# Patient Record
Sex: Female | Born: 1970 | Race: White | Hispanic: No | Marital: Married | Smoking: Never smoker
Health system: Southern US, Community
[De-identification: ages and names within clinical notes are randomized; demographics above are authoritative.]

## PROBLEM LIST (undated history)

## (undated) DIAGNOSIS — K219 Gastro-esophageal reflux disease without esophagitis: Secondary | ICD-10-CM

## (undated) DIAGNOSIS — J398 Other specified diseases of upper respiratory tract: Secondary | ICD-10-CM

## (undated) DIAGNOSIS — D649 Anemia, unspecified: Secondary | ICD-10-CM

## (undated) DIAGNOSIS — R0600 Dyspnea, unspecified: Secondary | ICD-10-CM

## (undated) DIAGNOSIS — Z9189 Other specified personal risk factors, not elsewhere classified: Secondary | ICD-10-CM

## (undated) DIAGNOSIS — F32A Depression, unspecified: Secondary | ICD-10-CM

## (undated) DIAGNOSIS — J302 Other seasonal allergic rhinitis: Secondary | ICD-10-CM

## (undated) DIAGNOSIS — L309 Dermatitis, unspecified: Secondary | ICD-10-CM

## (undated) DIAGNOSIS — IMO0001 Reserved for inherently not codable concepts without codable children: Secondary | ICD-10-CM

## (undated) DIAGNOSIS — F419 Anxiety disorder, unspecified: Secondary | ICD-10-CM

## (undated) DIAGNOSIS — F329 Major depressive disorder, single episode, unspecified: Secondary | ICD-10-CM

## (undated) DIAGNOSIS — J45909 Unspecified asthma, uncomplicated: Secondary | ICD-10-CM

## (undated) HISTORY — PX: EPIDURAL BLOCK INJECTION: SHX1516

---

## 1993-09-02 HISTORY — PX: WISDOM TOOTH EXTRACTION: SHX21

## 2000-10-06 ENCOUNTER — Other Ambulatory Visit: Admission: RE | Admit: 2000-10-06 | Discharge: 2000-10-06 | Payer: Self-pay | Admitting: Obstetrics and Gynecology

## 2001-04-20 ENCOUNTER — Other Ambulatory Visit: Admission: RE | Admit: 2001-04-20 | Discharge: 2001-04-20 | Payer: Self-pay | Admitting: Obstetrics and Gynecology

## 2002-05-12 ENCOUNTER — Other Ambulatory Visit: Admission: RE | Admit: 2002-05-12 | Discharge: 2002-05-12 | Payer: Self-pay | Admitting: Obstetrics and Gynecology

## 2004-05-06 ENCOUNTER — Inpatient Hospital Stay (HOSPITAL_COMMUNITY): Admission: AD | Admit: 2004-05-06 | Discharge: 2004-05-06 | Payer: Self-pay | Admitting: Obstetrics and Gynecology

## 2004-08-14 ENCOUNTER — Other Ambulatory Visit: Admission: RE | Admit: 2004-08-14 | Discharge: 2004-08-14 | Payer: Self-pay | Admitting: Obstetrics and Gynecology

## 2005-03-06 ENCOUNTER — Inpatient Hospital Stay (HOSPITAL_COMMUNITY): Admission: AD | Admit: 2005-03-06 | Discharge: 2005-03-09 | Payer: Self-pay | Admitting: Obstetrics and Gynecology

## 2005-09-19 ENCOUNTER — Other Ambulatory Visit: Admission: RE | Admit: 2005-09-19 | Discharge: 2005-09-19 | Payer: Self-pay | Admitting: Obstetrics and Gynecology

## 2005-11-15 ENCOUNTER — Ambulatory Visit (HOSPITAL_BASED_OUTPATIENT_CLINIC_OR_DEPARTMENT_OTHER): Admission: RE | Admit: 2005-11-15 | Discharge: 2005-11-15 | Payer: Self-pay | Admitting: Plastic Surgery

## 2005-11-15 ENCOUNTER — Encounter (INDEPENDENT_AMBULATORY_CARE_PROVIDER_SITE_OTHER): Payer: Self-pay | Admitting: Specialist

## 2006-09-17 ENCOUNTER — Other Ambulatory Visit: Admission: RE | Admit: 2006-09-17 | Discharge: 2006-09-17 | Payer: Self-pay | Admitting: Gynecology

## 2006-09-26 ENCOUNTER — Ambulatory Visit: Payer: Self-pay | Admitting: Oncology

## 2006-10-02 LAB — CBC WITH DIFFERENTIAL (CANCER CENTER ONLY)
BASO%: 0.6 % (ref 0.0–2.0)
Eosinophils Absolute: 0.1 10*3/uL (ref 0.0–0.5)
MONO#: 0.3 10*3/uL (ref 0.1–0.9)
MONO%: 4.1 % (ref 0.0–13.0)
NEUT#: 5.3 10*3/uL (ref 1.5–6.5)
Platelets: 336 10*3/uL (ref 145–400)
RBC: 4.43 10*6/uL (ref 3.70–5.32)
WBC: 8.1 10*3/uL (ref 3.9–10.0)

## 2006-10-02 LAB — IVY BLEEDING TIME - CHCC SATELLITE: Bleeding Time: 4 Minutes (ref 2.0–8.0)

## 2006-10-08 LAB — LACTATE DEHYDROGENASE: LDH: 132 U/L (ref 94–250)

## 2006-10-08 LAB — HYPERCOAGULABLE PANEL, COMPREHENSIVE
AntiThromb III Func: 107 % (ref 75–120)
Anticardiolipin IgA: <7 [APL'U]
Anticardiolipin IgG: <7 [GPL'U]
Anticardiolipin IgM: 10 [MPL'U]
Beta-2 Glyco I IgG: 5 U/mL
Beta-2-Glycoprotein I IgA: 8 U/mL
Beta-2-Glycoprotein I IgM: <4 U/mL
DRVVT 1:1 Mix: 43 s (ref 31.9–44.2)
DRVVT: 55.3 s — ABNORMAL HIGH (ref 31.9–44.2)
Homocysteine: 6 umol/L (ref 4.0–15.4)
PTT Lupus Anticoagulant: 49.4 s — ABNORMAL HIGH (ref 36.3–48.8)
PTTLA 4:1 Mix: 46.4 s (ref 36.3–48.8)
Protein C Activity: 148 % — ABNORMAL HIGH (ref 91–147)
Protein C, Total: 122 % (ref 70–140)
Protein S Activity: 113 % (ref 81–180)
Protein S Ag, Total: 143 % — ABNORMAL HIGH (ref 70–140)

## 2006-10-08 LAB — COMPREHENSIVE METABOLIC PANEL
ALT: 14 U/L (ref 0–35)
Alkaline Phosphatase: 55 U/L (ref 39–117)
CO2: 27 mEq/L (ref 19–32)
Sodium: 135 mEq/L (ref 135–145)
Total Bilirubin: 0.5 mg/dL (ref 0.3–1.2)
Total Protein: 7.4 g/dL (ref 6.0–8.3)

## 2006-10-08 LAB — VON WILLEBRAND FACTOR MULTIMER

## 2007-06-19 ENCOUNTER — Inpatient Hospital Stay (HOSPITAL_COMMUNITY): Admission: AD | Admit: 2007-06-19 | Discharge: 2007-06-21 | Payer: Self-pay | Admitting: Obstetrics and Gynecology

## 2009-12-01 DIAGNOSIS — D649 Anemia, unspecified: Secondary | ICD-10-CM

## 2009-12-01 HISTORY — DX: Anemia, unspecified: D64.9

## 2009-12-28 ENCOUNTER — Inpatient Hospital Stay (HOSPITAL_COMMUNITY): Admission: AD | Admit: 2009-12-28 | Discharge: 2009-12-30 | Payer: Self-pay | Admitting: Obstetrics and Gynecology

## 2010-11-20 LAB — CBC
Hemoglobin: 11 g/dL — ABNORMAL LOW (ref 12.0–15.0)
MCHC: 35.1 g/dL (ref 30.0–36.0)
MCV: 90.8 fL (ref 78.0–100.0)
Platelets: 249 10*3/uL (ref 150–400)
RDW: 13.3 % (ref 11.5–15.5)
WBC: 13.7 10*3/uL — ABNORMAL HIGH (ref 4.0–10.5)

## 2011-01-15 NOTE — Discharge Summary (Signed)
NAMEKEYARIA, Denise Barr                 ACCOUNT NO.:  [id number]   MEDICAL RECORD NO.:  [id number]          PATIENT TYPE:  INP   LOCATION:  9102                          FACILITY:  [facility]   PHYSICIAN:  Malachi Pro. Ambrose Mantle, M.D. DATE OF BIRTH:  1971/03/24   DATE OF ADMISSION:  06/19/2007  DATE OF DISCHARGE:  06/21/2007                               DISCHARGE SUMMARY   This is a 41 year old white married female, para 1-0-0-1, gravida 2, EDC  June 27, 2007, admitted with irregular contractions.  The cervix was  5 cm dilated.  Blood group and type A positive, negative antibody,  nonreactive serology, rubella immune, hepatitis B surface antigen  negative, HIV negative, GC and chlamydia negative.  Cystic fibrosis  negative.  One-hour Glucola 126.  Group B Strep was negative.  Vaginal  ultrasound on December 03, 2006:  Crown-rump length 3.58 cm, 10 weeks 4  days, [facility] June 27, 2007.  She declined first and second-trimester  screens.  An 18-week ultrasound showed normal anatomy, compatible with  dates.  The patient was treated with ferrous sulfate for anemia.  The  cervix was 2 cm on June 09, 2007, and on the day of admission with  contractions 4-7 minutes apart the cervix was 5 cm, 80%, vertex at a -2   PAST MEDICAL HISTORY:  No known allergies. Wisdom teeth extracted in  1995.  No illnesses.   ALCOHOL, TOBACCO, AND DRUGS:  None.   FAMILY HISTORY:  Mother with factor V Leiden.  Maternal grandmother with  throat cancer and diabetes.  Maternal grandfather with congestive heart  failure.   OBSTETRIC HISTORY:  In July 2006, an 8 pounds female vaginally, no  complications.   PHYSICAL EXAMINATION ON ADMISSION:  VITAL SIGNS:  Normal vital signs.  HEART/LUNGS:  Normal.  ABDOMEN:  Soft.  Fundal height 35.5 cm.  Fetal heart tones normal.  PELVIC:  Cervix 7 cm, 90%, vertex at a -2 with a bulging bag of waters.   At 5:48 p.m., with the cervix 8-9 cm, 100%, vertex at a -2, artificial  rupture of  membranes produced clear fluid.  She progressed to full  dilatation, pushed well, and delivered spontaneously LOA over an intact  perineum a living female infant, 6 pounds 15 ounces, without Apgars of 9  at one and 9 at five minutes.  Placenta was intact.  The uterus was  normal.  A small right labial laceration was hemostatic and not  repaired.  Postpartum, the patient did well and was discharged on the  second postpartum day.  Initial hemoglobin 11.1, hematocrit 31.8, white  count 10,400.  Follow-up hemoglobin 11.4.  Platelet count was 336,000.  RPR nonreactive.   FINAL DIAGNOSIS:  Intrauterine pregnancy at 39 weeks delivered LOA.   OPERATION:  Spontaneous delivery LOA.   FINAL CONDITION:  Improved.   INSTRUCTIONS:  Include our regular discharge instructions included in  our booklet.  The patient is advised to take Percocet 5/325, 20 tablets,  1 q.4-6 h. as needed for pain, and return to the office in 6 weeks for  follow-up examination.  Malachi Pro. Ambrose Mantle, M.D.  Electronically Signed     TFH/MEDQ  D:  06/21/2007  T:  06/22/2007  Job:  [id number]

## 2011-01-18 NOTE — Discharge Summary (Signed)
NAMEJODE, LIPPE                 ACCOUNT NO.:  [id number]   MEDICAL RECORD NO.:  [id number]          PATIENT TYPE:  INP   LOCATION:  9134                          FACILITY:  [facility]   PHYSICIAN:  Malachi Pro. Ambrose Mantle, M.D. DATE OF BIRTH:  Feb 01, 1971   DATE OF ADMISSION:  03/06/2005  DATE OF DISCHARGE:  03/09/2005                                 DISCHARGE SUMMARY   HISTORY OF PRESENT ILLNESS:  40 year old white female para 0, gravida 1, 40  weeks by 10 week ultrasound with EDC of March 07, 2005 presented complaining  of regular contractions, no vaginal bleeding or rupture of membranes, good  fetal movement.  She was evaluated in maternity admission unit with the  cervix 5 to 6 cm and regular contractions.  She was admitted and received an  epidural.  Blood group and type A positive, negative antibody, RPR  nonreactive, rubella immune, hepatitis B surface antigen negative, GC and  Chlamydia negative.  Group B Strep negative. One hour Glucola 151.  Three  hour GTT 78, 110, 104 and 100.  The patient was said to have PCOS and she  was on metformin.  She was exposed to parvo virus, had negative titers.   PAST SURGICAL HISTORY:  Wisdom teeth extraction.   PHYSICAL EXAMINATION:  VITAL SIGNS:  On admission her vital signs were  normal.  HEART:  Normal size and sounds.  No murmurs.  LUNGS:  Clear to auscultation.  ABDOMEN:  Soft and gravid.  Contractions every three to four minutes.  Mild  variable decelerations.  Positive scalp stimulation.   HOSPITAL COURSE:  Dr. Leighton Roach Meisinger examined her at 3:25 A.M. and the  cervix was rim, dilated, vertex at a +1 station.  Artificial rupture of the  membranes produced clear fluid.  She progressed to complete dilatation and  pushed well.  She had a spontaneous vaginal delivery of a living female  infant, 8 pounds 0 ounces, Apgar's of 9 at 1 minute and 9 at 5 minutes.  The  infant was delivered LOA.  The right arm delivered after the head.  Placenta  spontaneous and intact.  Small right labial and first degree vaginal  lacerations repaired with 3-0 Vicryl, left labial laceration hemostatic and  not repaired.  Blood loss less than 500 cc.  Postpartum the patient did well  and was discharged on the second postpartum day.  RPR was nonreactive.  Hemoglobin was 11.2, hematocrit 33.3, white count 13,500, platelet count  296,000.   FINAL DIAGNOSIS:  Intrauterine pregnancy at term delivered LOA.   PROCEDURE:  Spontaneous delivery LOA.  Repair first degree vaginal and right  labial lacerations.   CONDITION ON DISCHARGE:  Final condition improved.   DISCHARGE INSTRUCTIONS:  Instructions include our regular discharge  instruction booklet.   DISCHARGE MEDICATIONS:  Percocet 5/325 mg, #20 tablets, take one q.4-6h. as  needed for pain.   FOLLOW UP:  Patient is advised to return in six weeks for follow up  examination.       TFH/MEDQ  D:  03/09/2005  T:  03/09/2005  Job:  [id number]

## 2011-01-18 NOTE — Op Note (Signed)
Denise Barr, Denise Barr                 ACCOUNT NO.:  [id number]   MEDICAL RECORD NO.:  [id number]          PATIENT TYPE:  AMB   LOCATION:  DSC                          FACILITY:  [facility]   PHYSICIAN:  Alfredia Ferguson, M.D.  DATE OF BIRTH:  [birth year]   DATE OF PROCEDURE:  11/15/2005  DATE OF DISCHARGE:                                 OPERATIVE REPORT   PREOPERATIVE DIAGNOSES:  1.  Biopsy-proven dysplastic nevus, left upper back.  2.  Biopsy-proven dysplastic nevus, upper gluteal cleft.   POSTOPERATIVE DIAGNOSES:  1.  Biopsy-proven dysplastic nevus, left upper back.  2.  Biopsy-proven dysplastic nevus, upper gluteal cleft.   OPERATION PERFORMED:  1.  Excision of biopsy-proven dysplastic nevus, left upper back, with 2- to      3-mm margins.  2.  Excision of dysplastic nevus, upper gluteal cleft, with 2- to 3-mm      margins.   SURGEON:  Alfredia Ferguson, M.D.   ANESTHESIA:  2% Xylocaine and 1:100,000 epinephrine.   INDICATIONS FOR SURGERY:  This is a 40 year old woman with biopsy-proven  dysplastic nevi on her back, 1 in the gluteal cleft and 1 on the left upper  back.  The patient has been advised to excise these lesions in order to  clear the margins.  The patient understands she is trading what she has for  a permanent and potentially unsightly scar.  In spite that, she wishes to  proceed with the surgery.   DESCRIPTION OF OPERATION:  Skin marks were placed in an elliptical fashion  around the lesion in the left upper back and the gluteal cleft.  Local  anesthesia using 2% Xylocaine and 1:100,000 epinephrine was infiltrated.  Both areas were prepped with Betadine and draped with sterile drapes.  After  waiting 10 minutes, an elliptical excision of the lesion in the left upper  back was carried out with 2- to 3-mm margins.  The specimen was excised at  the level of the subcutaneous tissue.  The specimen was submitted for  Pathology.  Wound edges were undermined for a distance of  several  millimeters in all directions.  Hemostasis was accomplished using pressure.  The wound was closed by approximating the dermis with multiple interrupted 3-  0 Monocryl sutures.  The skin edges were united with a running 4-0 Monocryl  intracuticular.  Steri-Strips were applied to the skin edges.  Attention was  directed to the buttock area, where an identical excision was carried out.  Again, wound edges were undermined for  a distance of several millimeters.  Wound was closed by approximating the  dermis with multiple interrupted 3-0 Monocryl and the skin edges were united  with a running 5-0 nylon suture.  The patient tolerated the procedure well.  Both areas were cleansed and dried and light dressings were applied.      Alfredia Ferguson, M.D.  Electronically Signed     WBB/MEDQ  D:  11/15/2005  T:  11/16/2005  Job:  [id number]   cc:   Hope M. Danella Deis, M.D.  Fax: [phone]

## 2011-06-12 LAB — CBC
HCT: 33 — ABNORMAL LOW
Hemoglobin: 11.4 — ABNORMAL LOW
MCV: 88.2
RBC: 3.61 — ABNORMAL LOW
RBC: 3.64 — ABNORMAL LOW
RDW: 12.9
WBC: 10.4

## 2011-06-12 LAB — RPR: RPR Ser Ql: NONREACTIVE

## 2012-10-06 ENCOUNTER — Other Ambulatory Visit: Payer: Self-pay | Admitting: Obstetrics and Gynecology

## 2012-10-06 DIAGNOSIS — Z1231 Encounter for screening mammogram for malignant neoplasm of breast: Secondary | ICD-10-CM

## 2012-10-22 ENCOUNTER — Ambulatory Visit
Admission: RE | Admit: 2012-10-22 | Discharge: 2012-10-22 | Disposition: A | Discharge status: Home / Self Care | Payer: BC Managed Care – PPO | Source: Ambulatory Visit | Attending: Obstetrics and Gynecology | Admitting: Obstetrics and Gynecology

## 2013-05-20 ENCOUNTER — Other Ambulatory Visit: Payer: Self-pay | Admitting: Family Medicine

## 2013-05-20 DIAGNOSIS — E010 Iodine-deficiency related diffuse (endemic) goiter: Secondary | ICD-10-CM

## 2013-05-26 ENCOUNTER — Ambulatory Visit
Admission: RE | Admit: 2013-05-26 | Discharge: 2013-05-26 | Disposition: A | Discharge status: Home / Self Care | Payer: BC Managed Care – PPO | Source: Ambulatory Visit | Attending: Family Medicine | Admitting: Family Medicine

## 2013-05-26 DIAGNOSIS — E010 Iodine-deficiency related diffuse (endemic) goiter: Secondary | ICD-10-CM

## 2013-05-28 ENCOUNTER — Other Ambulatory Visit: Payer: Self-pay | Admitting: Family Medicine

## 2013-05-28 ENCOUNTER — Ambulatory Visit
Admission: RE | Admit: 2013-05-28 | Discharge: 2013-05-28 | Disposition: A | Discharge status: Home / Self Care | Payer: BC Managed Care – PPO | Source: Ambulatory Visit | Attending: Family Medicine | Admitting: Family Medicine

## 2013-05-28 MED ORDER — IOHEXOL 300 MG/ML  SOLN
75.0000 mL | Freq: Once | INTRAMUSCULAR | Status: AC | PRN
  Administered 2013-05-28: 75 mL via INTRAVENOUS

## 2013-09-23 ENCOUNTER — Other Ambulatory Visit: Payer: Self-pay

## 2013-09-23 DIAGNOSIS — Z1231 Encounter for screening mammogram for malignant neoplasm of breast: Secondary | ICD-10-CM

## 2013-10-11 ENCOUNTER — Encounter (HOSPITAL_COMMUNITY): Payer: Self-pay | Admitting: Pharmacy Technician

## 2013-10-11 ENCOUNTER — Other Ambulatory Visit: Payer: Self-pay | Admitting: Gastroenterology

## 2013-10-11 ENCOUNTER — Encounter (HOSPITAL_COMMUNITY): Payer: Self-pay | Admitting: *Deleted

## 2013-10-22 ENCOUNTER — Ambulatory Visit (HOSPITAL_COMMUNITY): Payer: BC Managed Care – PPO | Admitting: Anesthesiology

## 2013-10-22 ENCOUNTER — Encounter (HOSPITAL_COMMUNITY)
Admission: RE | Disposition: A | Discharge status: Home / Self Care | Payer: Self-pay | Source: Ambulatory Visit | Attending: Gastroenterology

## 2013-10-22 ENCOUNTER — Encounter (HOSPITAL_COMMUNITY): Payer: Self-pay | Admitting: *Deleted

## 2013-10-22 ENCOUNTER — Ambulatory Visit (HOSPITAL_COMMUNITY)
Admission: RE | Admit: 2013-10-22 | Discharge: 2013-10-22 | Disposition: A | Discharge status: Home / Self Care | Payer: BC Managed Care – PPO | Source: Ambulatory Visit | Attending: Gastroenterology | Admitting: Gastroenterology

## 2013-10-22 ENCOUNTER — Encounter (HOSPITAL_COMMUNITY): Payer: BC Managed Care – PPO | Admitting: Anesthesiology

## 2013-10-22 DIAGNOSIS — J398 Other specified diseases of upper respiratory tract: Secondary | ICD-10-CM | POA: Insufficient documentation

## 2013-10-22 DIAGNOSIS — R059 Cough, unspecified: Secondary | ICD-10-CM | POA: Insufficient documentation

## 2013-10-22 DIAGNOSIS — R05 Cough: Secondary | ICD-10-CM | POA: Insufficient documentation

## 2013-10-22 DIAGNOSIS — D649 Anemia, unspecified: Secondary | ICD-10-CM | POA: Insufficient documentation

## 2013-10-22 DIAGNOSIS — J988 Other specified respiratory disorders: Principal | ICD-10-CM

## 2013-10-22 DIAGNOSIS — Z808 Family history of malignant neoplasm of other organs or systems: Secondary | ICD-10-CM | POA: Insufficient documentation

## 2013-10-22 DIAGNOSIS — K219 Gastro-esophageal reflux disease without esophagitis: Secondary | ICD-10-CM | POA: Insufficient documentation

## 2013-10-22 HISTORY — DX: Dermatitis, unspecified: L30.9

## 2013-10-22 HISTORY — DX: Gastro-esophageal reflux disease without esophagitis: K21.9

## 2013-10-22 HISTORY — DX: Anemia, unspecified: D64.9

## 2013-10-22 HISTORY — PX: BRAVO PH STUDY: SHX5421

## 2013-10-22 HISTORY — DX: Other specified diseases of upper respiratory tract: J39.8

## 2013-10-22 HISTORY — PX: ESOPHAGOGASTRODUODENOSCOPY: SHX5428

## 2013-10-22 SURGERY — EGD (ESOPHAGOGASTRODUODENOSCOPY)
Anesthesia: Monitor Anesthesia Care

## 2013-10-22 MED ORDER — KETAMINE HCL 10 MG/ML IJ SOLN
INTRAMUSCULAR | Status: DC | PRN
  Administered 2013-10-22: 25 mg via INTRAVENOUS

## 2013-10-22 MED ORDER — MIDAZOLAM HCL 5 MG/5ML IJ SOLN
INTRAMUSCULAR | Status: DC | PRN
  Administered 2013-10-22 (×2): 1 mg via INTRAVENOUS

## 2013-10-22 MED ORDER — FENTANYL CITRATE 0.05 MG/ML IJ SOLN
INTRAMUSCULAR | Status: DC | PRN
  Administered 2013-10-22: 25 ug via INTRAVENOUS

## 2013-10-22 MED ORDER — SODIUM CHLORIDE 0.9 % IV SOLN: INTRAVENOUS | Status: DC

## 2013-10-22 MED ORDER — ONDANSETRON HCL 4 MG/2ML IJ SOLN
INTRAMUSCULAR | Status: AC
  Filled 2013-10-22: qty 2

## 2013-10-22 MED ORDER — ONDANSETRON HCL 4 MG/2ML IJ SOLN
INTRAMUSCULAR | Status: DC | PRN
  Administered 2013-10-22 (×2): 2 mg via INTRAVENOUS

## 2013-10-22 MED ORDER — MIDAZOLAM HCL 2 MG/2ML IJ SOLN
INTRAMUSCULAR | Status: AC
  Filled 2013-10-22: qty 2

## 2013-10-22 MED ORDER — FENTANYL CITRATE 0.05 MG/ML IJ SOLN
INTRAMUSCULAR | Status: AC
  Filled 2013-10-22: qty 2

## 2013-10-22 MED ORDER — LACTATED RINGERS IV SOLN
INTRAVENOUS | Status: DC
  Administered 2013-10-22: 1000 mL via INTRAVENOUS

## 2013-10-22 MED ORDER — LACTATED RINGERS IV SOLN
INTRAVENOUS | Status: DC | PRN
  Administered 2013-10-22: 11:00:00 via INTRAVENOUS

## 2013-10-22 MED ORDER — PROPOFOL INFUSION 10 MG/ML OPTIME
INTRAVENOUS | Status: DC | PRN
  Administered 2013-10-22: 100 ug/kg/min via INTRAVENOUS

## 2013-10-22 MED ORDER — KETAMINE HCL 10 MG/ML IJ SOLN
INTRAMUSCULAR | Status: AC
  Filled 2013-10-22: qty 1

## 2013-10-22 NOTE — Anesthesia Postprocedure Evaluation (Signed)
Anesthesia Post Note  Patient: Denise Barr  Procedure(s) Performed: Procedure(s) (LRB): ESOPHAGOGASTRODUODENOSCOPY (EGD) (N/A) BRAVO PH STUDY (N/A)  Anesthesia type: MAC  Patient location: PACU  Post pain: Pain level controlled  Post assessment: Post-op Vital signs reviewed  Last Vitals: BP 116/74  Pulse 76  Temp(Src) 36.9 C (Oral)  Resp 29  Ht 5\' 5"  (1.651 m)  Wt 138 lb (62.596 kg)  BMI 22.96 kg/m2  SpO2 100%  LMP 09/20/2013  Post vital signs: Reviewed  Level of consciousness: awake  Complications: No apparent anesthesia complications

## 2013-10-22 NOTE — H&P (Signed)
  Denise DavenportHeather S Barr HPI: This is a 43 year old female with complaints of new onset asthma.  Inhalers were provide, but she did not have any significant improvement.  She underwent further work up for her family history of thyroid cancer in her mother and sister.  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  Further evaluation was pursued by Dr. Jenne PaneBates and there is a suspicion that her tracheal stenosis is secondary to GERD.  She denies any overt GERD symptoms and treatment with omeprazole BID did not change any of her symptoms.  She continues to exercise with running, but she is not running excessive distances.  However, when she does start she has some issues with breathing.  The patient also complains of cough and clear of her throat.  She feels as if there is mucus in this region.    Past Medical History  Diagnosis Date  . 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 GERD (gastroesophageal reflux disease)     occasional   . Anemia april 2011    with pregnancy only  . Eczema     Past Surgical History  Procedure Laterality Date  . Wisdom tooth extraction  1995  . Epidural block injection      x 3 with childbirth    History reviewed. No pertinent family history.  Social History:  reports that she has never smoked. She has never used smokeless tobacco. She reports that she drinks alcohol. She reports that she does not use illicit drugs.  Allergies: No Known Allergies  Medications: Scheduled: Continuous:  No results found for this or any previous visit (from the past 24 hour(s)).   No results found.  ROS:  As stated above in the HPI otherwise negative.  Blood pressure 125/78, pulse 76, temperature 98.4 F (36.9 C), temperature source Oral, resp. rate 19, height 5\' 5"  (1.651 m), weight 138 lb (62.596 kg), last menstrual period 09/20/2013, SpO2 99.00%.    PE: Gen: NAD, Alert and Oriented HEENT:  [place]/AT, EOMI Neck: Supple, no LAD Lungs: CTA  Bilaterally CV: RRR without M/G/R ABM: Soft, NTND, +BS Ext: No C/C/E  Assessment/Plan: 1) ? Tracheal stenosis secondary to GERD.  Plan: 1) EGD with BRAVO.  Rilley Poulter D 10/22/2013, 10:08 AM

## 2013-10-22 NOTE — Discharge Instructions (Signed)
Monitored Anesthesia Care  °Monitored anesthesia care is an anesthesia service for a medical procedure. Anesthesia is the loss of the ability to feel pain. It is produced by medications called anesthetics. It may affect a small area of your body (local anesthesia), a large area of your body (regional anesthesia), or your entire body (general anesthesia). The need for monitored anesthesia care depends your procedure, your condition, and the potential need for regional or general anesthesia. It is often provided during procedures where:  °· General anesthesia may be needed if there are complications. This is because you need special care when you are under general anesthesia.   °· You will be under local or regional anesthesia. This is so that you are able to have higher levels of anesthesia if needed.   °· You will receive calming medications (sedatives). This is especially the case if sedatives are given to put you in a semi-conscious state of relaxation (deep sedation). This is because the amount of sedative needed to produce this state can be hard to predict. Too much of a sedative can produce general anesthesia. °Monitored anesthesia care is performed by one or more caregivers who have special training in all types of anesthesia. You will need to meet with these caregivers before your procedure. During this meeting, they will ask you about your medical history. They will also give you instructions to follow. (For example, you will need to stop eating and drinking before your procedure. You may also need to stop or change medications you are taking.) During your procedure, your caregivers will stay with you. They will:  °· Watch your condition. This includes watching you blood pressure, breathing, and level of pain.   °· Diagnose and treat problems that occur.   °· Give medications if they are needed. These may include calming medications (sedatives) and anesthetics.   °· Make sure you are comfortable.   °Having  monitored anesthesia care does not necessarily mean that you will be under anesthesia. It does mean that your caregivers will be able to manage anesthesia if you need it or if it occurs. It also means that you will be able to have a different type of anesthesia than you are having if you need it. When your procedure is complete, your caregivers will continue to watch your condition. They will make sure any medications wear off before you are allowed to go home.  °Document Released: 05/15/2005 Document Revised: 12/14/2012 Document Reviewed: 09/30/2012 °ExitCare® Patient Information ©2014 [employer]. °Esophagogastroduodenoscopy °Care After °Refer to this sheet in the next few weeks. These instructions provide you with information on caring for yourself after your procedure. Your caregiver may also give you more specific instructions. Your treatment has been planned according to current medical practices, but problems sometimes occur. Call your caregiver if you have any problems or questions after your procedure.  °HOME CARE INSTRUCTIONS °· Do not eat or drink anything until the numbing medicine (local anesthetic) has worn off and your gag reflex has returned. You will know that the local anesthetic has worn off when you can swallow comfortably. °· Do not drive for 12 hours after the procedure or as directed by your caregiver. °· Only take medicines as directed by your caregiver. °SEEK MEDICAL CARE IF:  °· You cannot stop coughing. °· You are not urinating at all or less than usual. °SEEK IMMEDIATE MEDICAL CARE IF: °· You have difficulty swallowing. °· You cannot eat or drink. °· You have worsening throat or chest pain. °· You have dizziness, lightheadedness, or   you faint. °· You have nausea or vomiting. °· You have chills. °· You have a fever. °· You have severe abdominal pain. °· You have black, tarry, or bloody stools. °Document Released: 08/05/2012 Document Reviewed: 08/05/2012 °ExitCare® Patient Information  ©2014 [employer]. ° °

## 2013-10-22 NOTE — Transfer of Care (Signed)
Immediate Anesthesia Transfer of Care Note  Patient: Denise DavenportHeather S Barr  Procedure(s) Performed: Procedure(s): ESOPHAGOGASTRODUODENOSCOPY (EGD) (N/A) BRAVO PH STUDY (N/A)  Patient Location: PACU  Anesthesia Type:MAC  Level of Consciousness: awake, alert , oriented and patient cooperative  Airway & Oxygen Therapy: Patient Spontanous Breathing and Patient connected to nasal cannula oxygen  Post-op Assessment: Report given to PACU RN and Post -op Vital signs reviewed and stable  Post vital signs: stable  Complications: No apparent anesthesia complications

## 2013-10-22 NOTE — Anesthesia Preprocedure Evaluation (Signed)
Anesthesia Evaluation  Patient identified by MRN, date of birth, ID band Patient awake    Reviewed: Allergy & Precautions, H&P , NPO status , Patient's Chart, lab work & pertinent test results  Airway Mallampati: I TM Distance: >3 FB Neck ROM: Full    Dental  (+) Dental Advisory Given   Pulmonary neg pulmonary ROS,  breath sounds clear to auscultation        Cardiovascular negative cardio ROS  Rhythm:Regular Rate:Normal     Neuro/Psych negative neurological ROS  negative psych ROS   GI/Hepatic negative GI ROS, Neg liver ROS, GERD-  ,  Endo/Other  negative endocrine ROS  Renal/GU negative Renal ROS     Musculoskeletal negative musculoskeletal ROS (+)   Abdominal   Peds  Hematology negative hematology ROS (+) anemia ,   Anesthesia Other Findings   Reproductive/Obstetrics negative OB ROS                           Anesthesia Physical Anesthesia Plan  ASA: II  Anesthesia Plan: MAC   Post-op Pain Management:    Induction: Intravenous  Airway Management Planned:   Additional Equipment:   Intra-op Plan:   Post-operative Plan:   Informed Consent: I have reviewed the patients History and Physical, chart, labs and discussed the procedure including the risks, benefits and alternatives for the proposed anesthesia with the patient or authorized representative who has indicated his/her understanding and acceptance.   Dental advisory given  Plan Discussed with: CRNA  Anesthesia Plan Comments:         Anesthesia Quick Evaluation

## 2013-10-22 NOTE — Op Note (Signed)
[facility] [address] [place], [id number]   OPERATIVE PROCEDURE REPORT  PATIENT: Denise Barr, Denise S.  MR#: [id number] BIRTHDATE: 1971-04-13  GENDER: Female ENDOSCOPIST: Jeani HawkingPatrick Rhemi Balbach, MD ASSISTANT:   Priscella MannAutumn Goldsmith, technician and Kevin Fentonerri Moore, RN PROCEDURE DATE: 10/22/2013 PROCEDURE:   EGD with BRAVO placement ASA CLASS:   Class II INDICATIONS: GERD MEDICATIONS: MAC sedation, administered by CRNA TOPICAL ANESTHETIC:  DESCRIPTION OF PROCEDURE:   After the risks benefits and alternatives of the procedure were thoroughly explained, informed consent was obtained.  The Pentax Gastroscope [id number]  endoscope was introduced through the mouth  and advanced to the second portion of the duodenum Without limitations.      The instrument was slowly withdrawn as the mucosa was fully examined.    FINDINGS: The upper, middle and distal third of the esophagus were carefully inspected and no abnormalities were noted.  The z-line was well seen at the GEJ - 40 cm.  The endoscope was pushed into the fundus which was normal including a retroflexed view.  The antrum, gastric body, first and second part of the duodenum were unremarkable.   Retroflexed views revealed no abnormalities. the first BRAVO was deployed without difficulty at 34 cm, however, the probe was not communicating with the recording unit.  The first BRAVO probe was removed with a snare and retrieved with a Roth net. A second BRAVO probe was successfully deployed, which was confirmed visually.  The scope was then withdrawn from the patient and the procedure terminated.  COMPLICATIONS: There were no complications. IMPRESSION: 1) Successful placment of BRAVO probe.  RECOMMENDATIONS: 1) Await BRAVO results.  _______________________________ eSignedJeani Hawking:  Shawnika Pepin, MD 10/22/2013 11:37 AM

## 2013-10-25 ENCOUNTER — Encounter (HOSPITAL_COMMUNITY): Payer: Self-pay | Admitting: Gastroenterology

## 2013-10-26 ENCOUNTER — Ambulatory Visit: Payer: BC Managed Care – PPO

## 2013-10-27 ENCOUNTER — Ambulatory Visit: Payer: BC Managed Care – PPO

## 2013-11-16 ENCOUNTER — Ambulatory Visit
Admission: RE | Admit: 2013-11-16 | Discharge: 2013-11-16 | Disposition: A | Discharge status: Home / Self Care | Payer: BC Managed Care – PPO | Source: Ambulatory Visit

## 2013-11-16 DIAGNOSIS — Z1231 Encounter for screening mammogram for malignant neoplasm of breast: Secondary | ICD-10-CM

## 2013-11-29 ENCOUNTER — Ambulatory Visit (HOSPITAL_COMMUNITY): Admit: 2013-11-29 | Payer: Self-pay | Admitting: Gastroenterology

## 2013-11-29 ENCOUNTER — Encounter (HOSPITAL_COMMUNITY): Payer: Self-pay

## 2013-11-29 SURGERY — IMPEDANCE PH STUDY, ESOPHAGUS

## 2014-02-12 IMAGING — CT CT NECK W/ CM
4 of 5 series · 17 of 33 positions shown, 19 images · IV contrast (75CC OMNI 300)
Comparison: 05/26/2013 ultrasound. No comparison CT.

CLINICAL DATA: Abnormal thyroid sonogram. Family history of
Hashimoto thyroiditis and thyroid cancer.

EXAM:
CT NECK WITH CONTRAST
TECHNIQUE: Multidetector CT imaging of the neck was performed using the
standard protocol following the bolus administration of intravenous
contrast.
CONTRAST:  75mL OMNIPAQUE IOHEXOL 300 MG/ML  SOLN

[Series 2: axial neck · axial · 0.43mm/px · z∈[-50,+120]mm · 5 of 103 slices shown]
[im 18/103  bone]
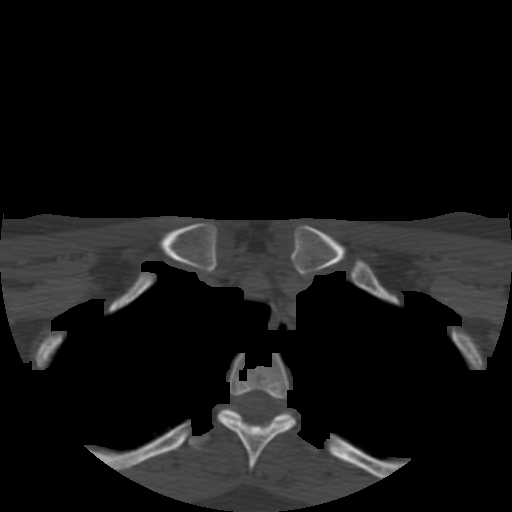
[im 35/103  bone]
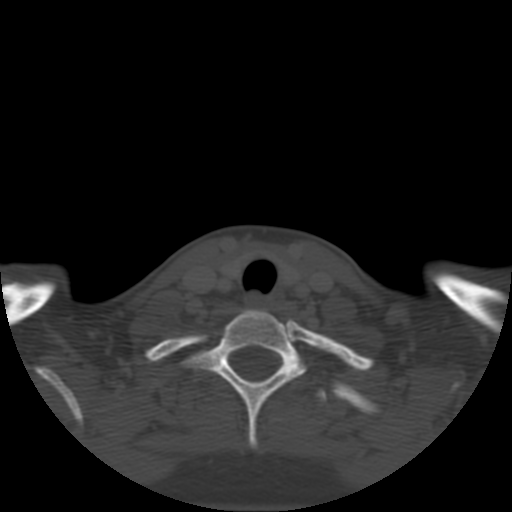
[im 52/103  bone]
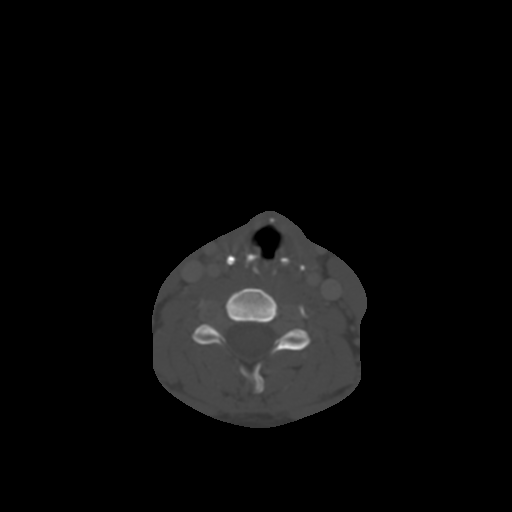
[im 69/103  bone]
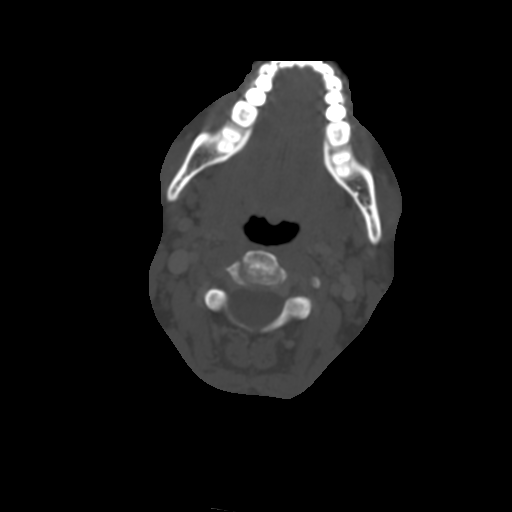
[im 86/103  bone]
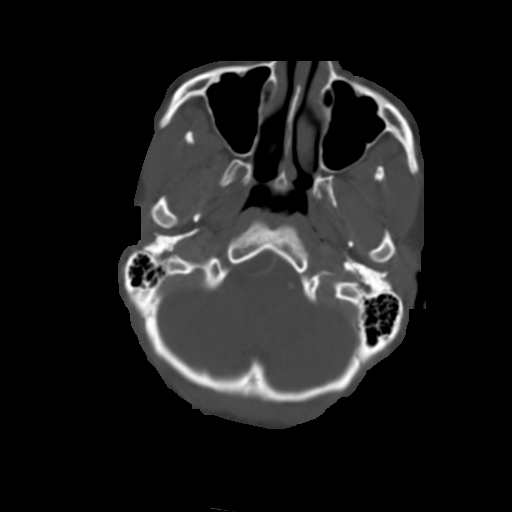

[Series 400: cor · coronal · 0.51mm/px · 3 of 74 slices shown]
[im 15/74  bone]
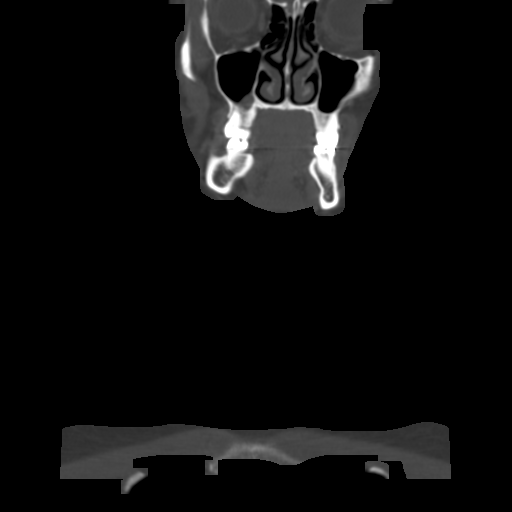
[im 30/74  bone]
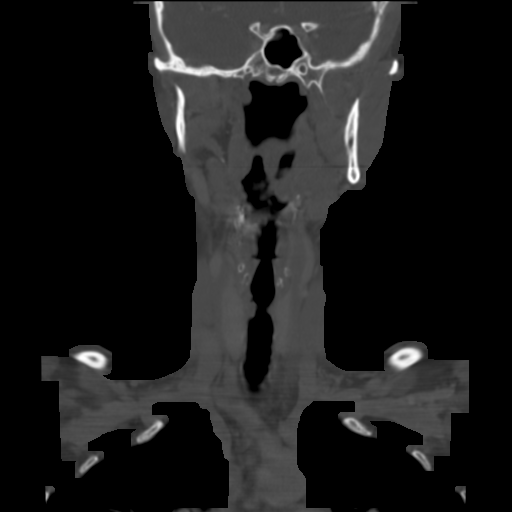
[im 44/74  bone]
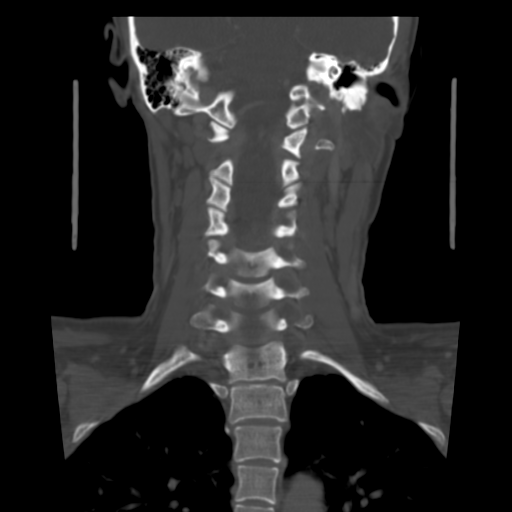

[Series 401: sag · sagittal · 0.51mm/px · 5 of 87 slices shown, 6 images]
[im 29/87  bone]
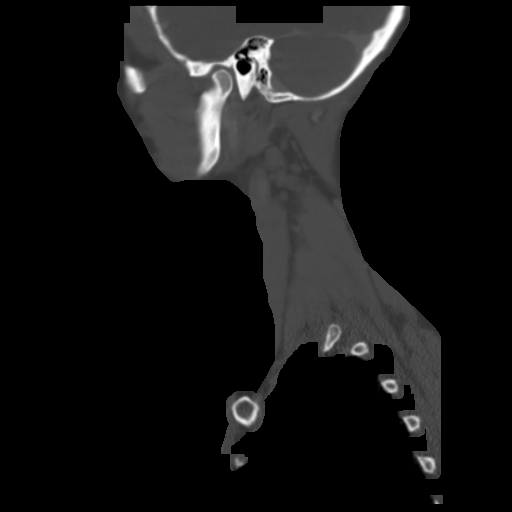
[im 36/87  bone]
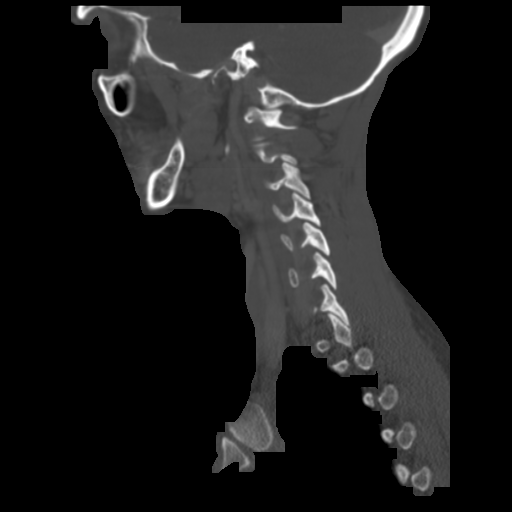
[im 44/87  soft-tissue]
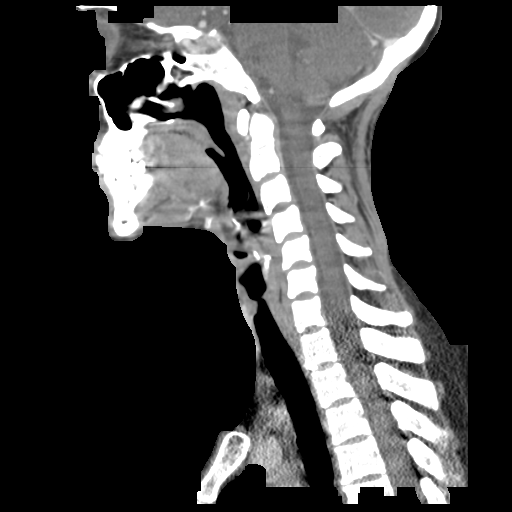
[im 44/87  bone]
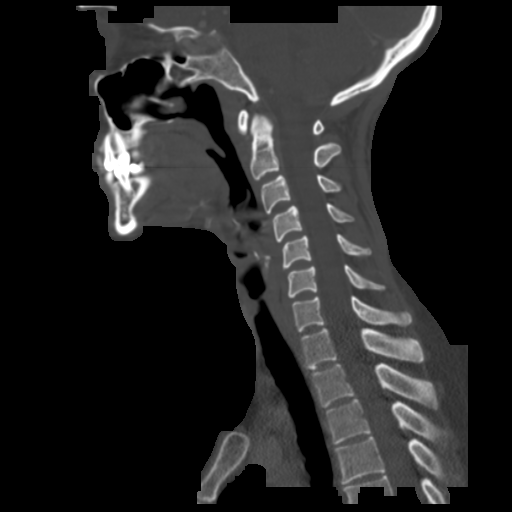
[im 51/87  bone]
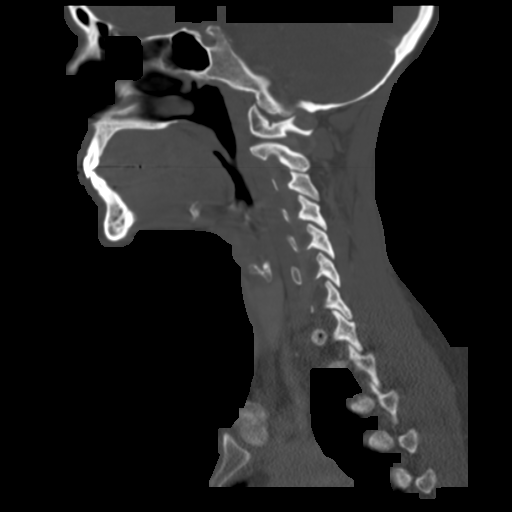
[im 58/87  bone]
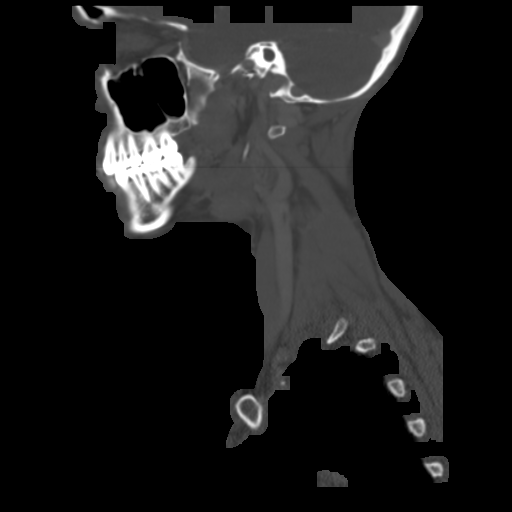

[Series 402: ang axial · axial · 0.51mm/px · z∈[-73,+77]mm · 4 of 106 slices shown, 5 images]
[im 22/106  soft-tissue]
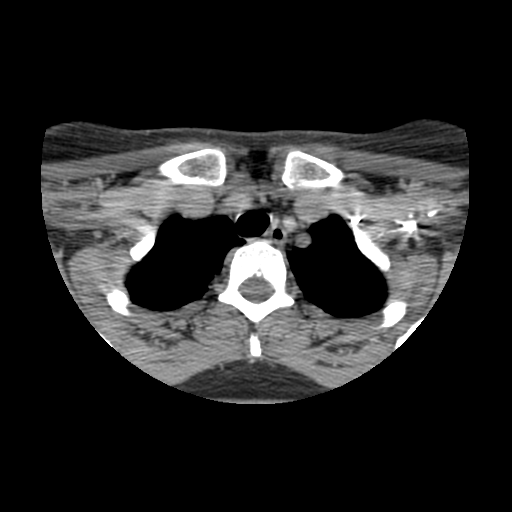
[im 22/106  bone]
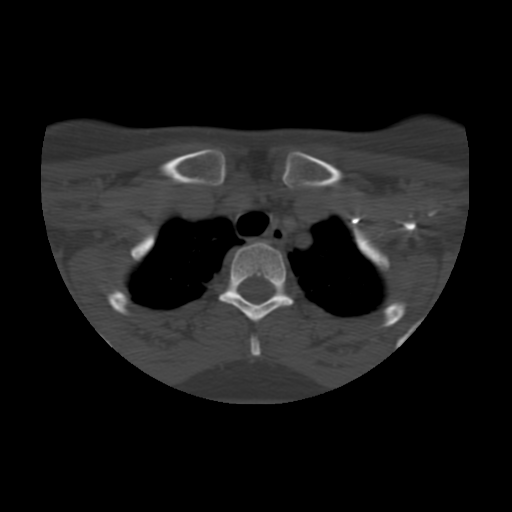
[im 43/106  bone]
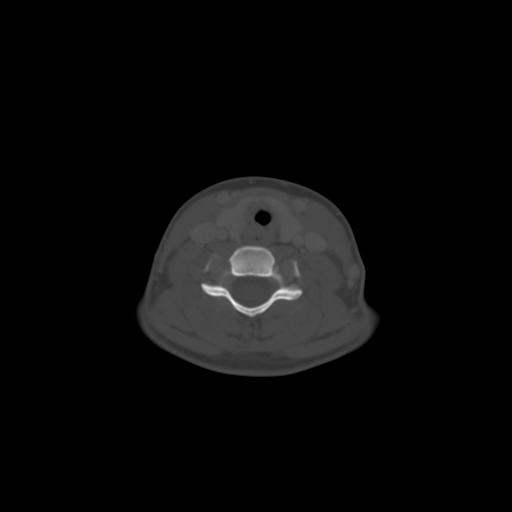
[im 64/106  bone]
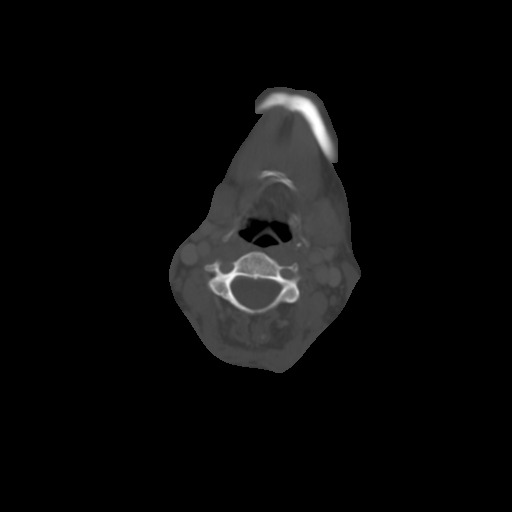
[im 85/106  bone]
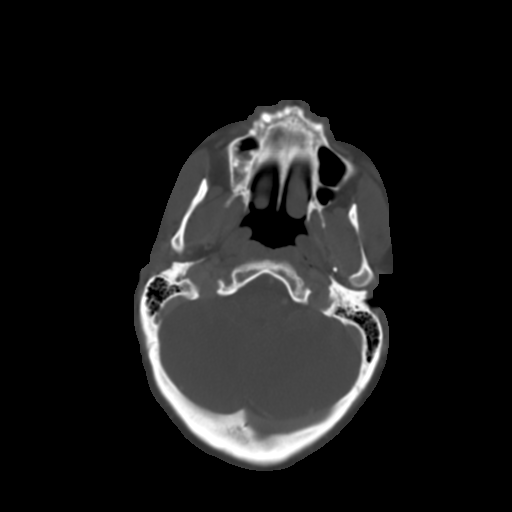

[17 of 33 positions shown; findings below may reference images not displayed]

FINDINGS: At the level of the thyroid gland, there is circumferential slightly
asymmetric thickening of the lining of the trachea. This is abnormal
and may represent result of inflammatory process or malignancy.
Direct visualization will be necessary for further delineation.
Result of infection felt unlikely.

Scattered increased number of normal-sized lymph nodes throughout
the neck, largest in the level 2 region with short axis dimension
less than 1 cm.

Lung apices are clear.  No bony destructive lesion.

Patient was moving/ swallowing during examine and evaluation of the
glottic region is limited.
IMPRESSION: At the level of the thyroid gland, there is circumferential slightly
asymmetric thickening of the lining of the trachea. This is abnormal
and may represent result of inflammatory process or malignancy.
Direct visualization will be necessary for further delineation.
Result of infection felt unlikely.

Scattered increased number of normal-sized lymph nodes throughout
the neck, largest in the level 2 region with short axis dimension
less than 1 cm.

Please see above.

These results will be called to the ordering clinician or
representative by the Radiologist Assistant, and communication
documented in the PACS Dashboard.

## 2014-06-06 ENCOUNTER — Ambulatory Visit
Admission: RE | Admit: 2014-06-06 | Discharge: 2014-06-06 | Disposition: A | Discharge status: Home / Self Care | Payer: BC Managed Care – PPO | Source: Ambulatory Visit | Attending: Allergy and Immunology | Admitting: Allergy and Immunology

## 2014-06-06 ENCOUNTER — Other Ambulatory Visit: Payer: Self-pay | Admitting: Allergy and Immunology

## 2014-06-06 DIAGNOSIS — R0602 Shortness of breath: Secondary | ICD-10-CM

## 2015-02-21 IMAGING — CR DG CHEST 2V
2 series · 2 of 2 positions shown · non-contrast
Comparison: None.

CLINICAL DATA: Cough, shortness of breath, asthma

EXAM:
CHEST  2 VIEW

[view not recorded (1 of 2)]
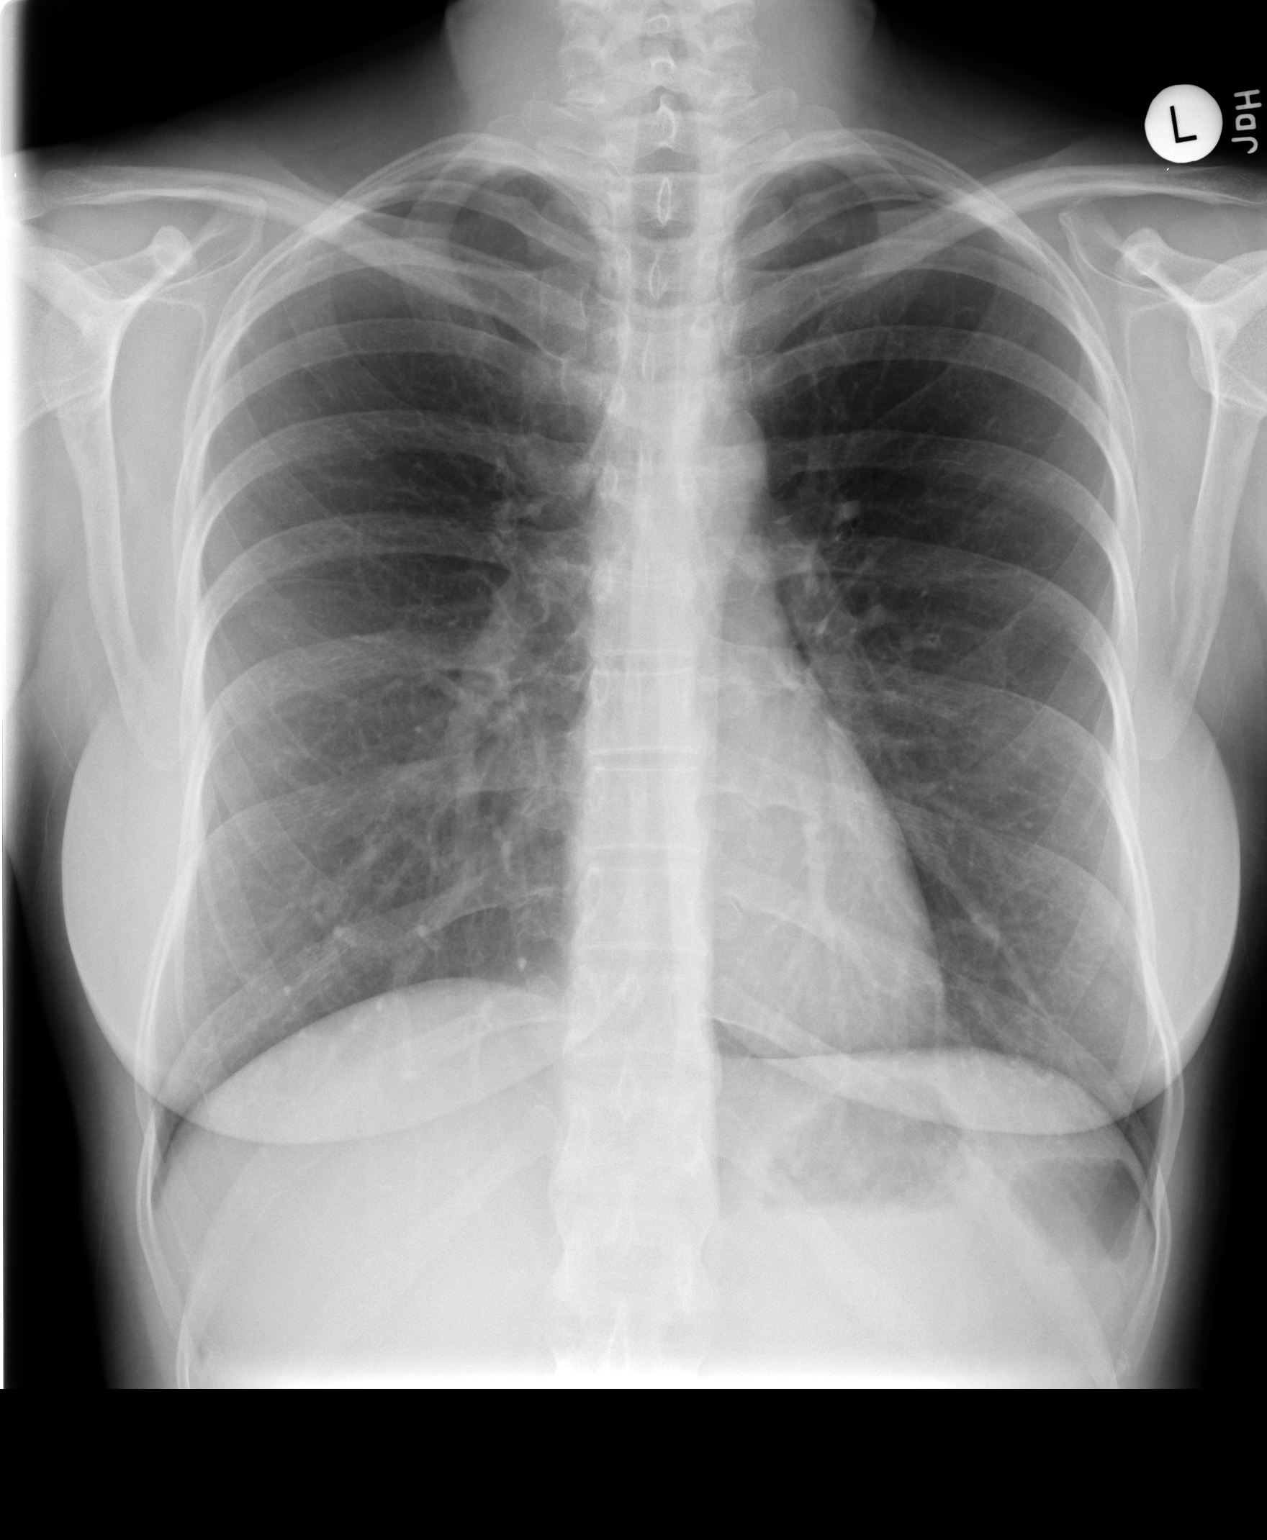

[view not recorded (2 of 2)]
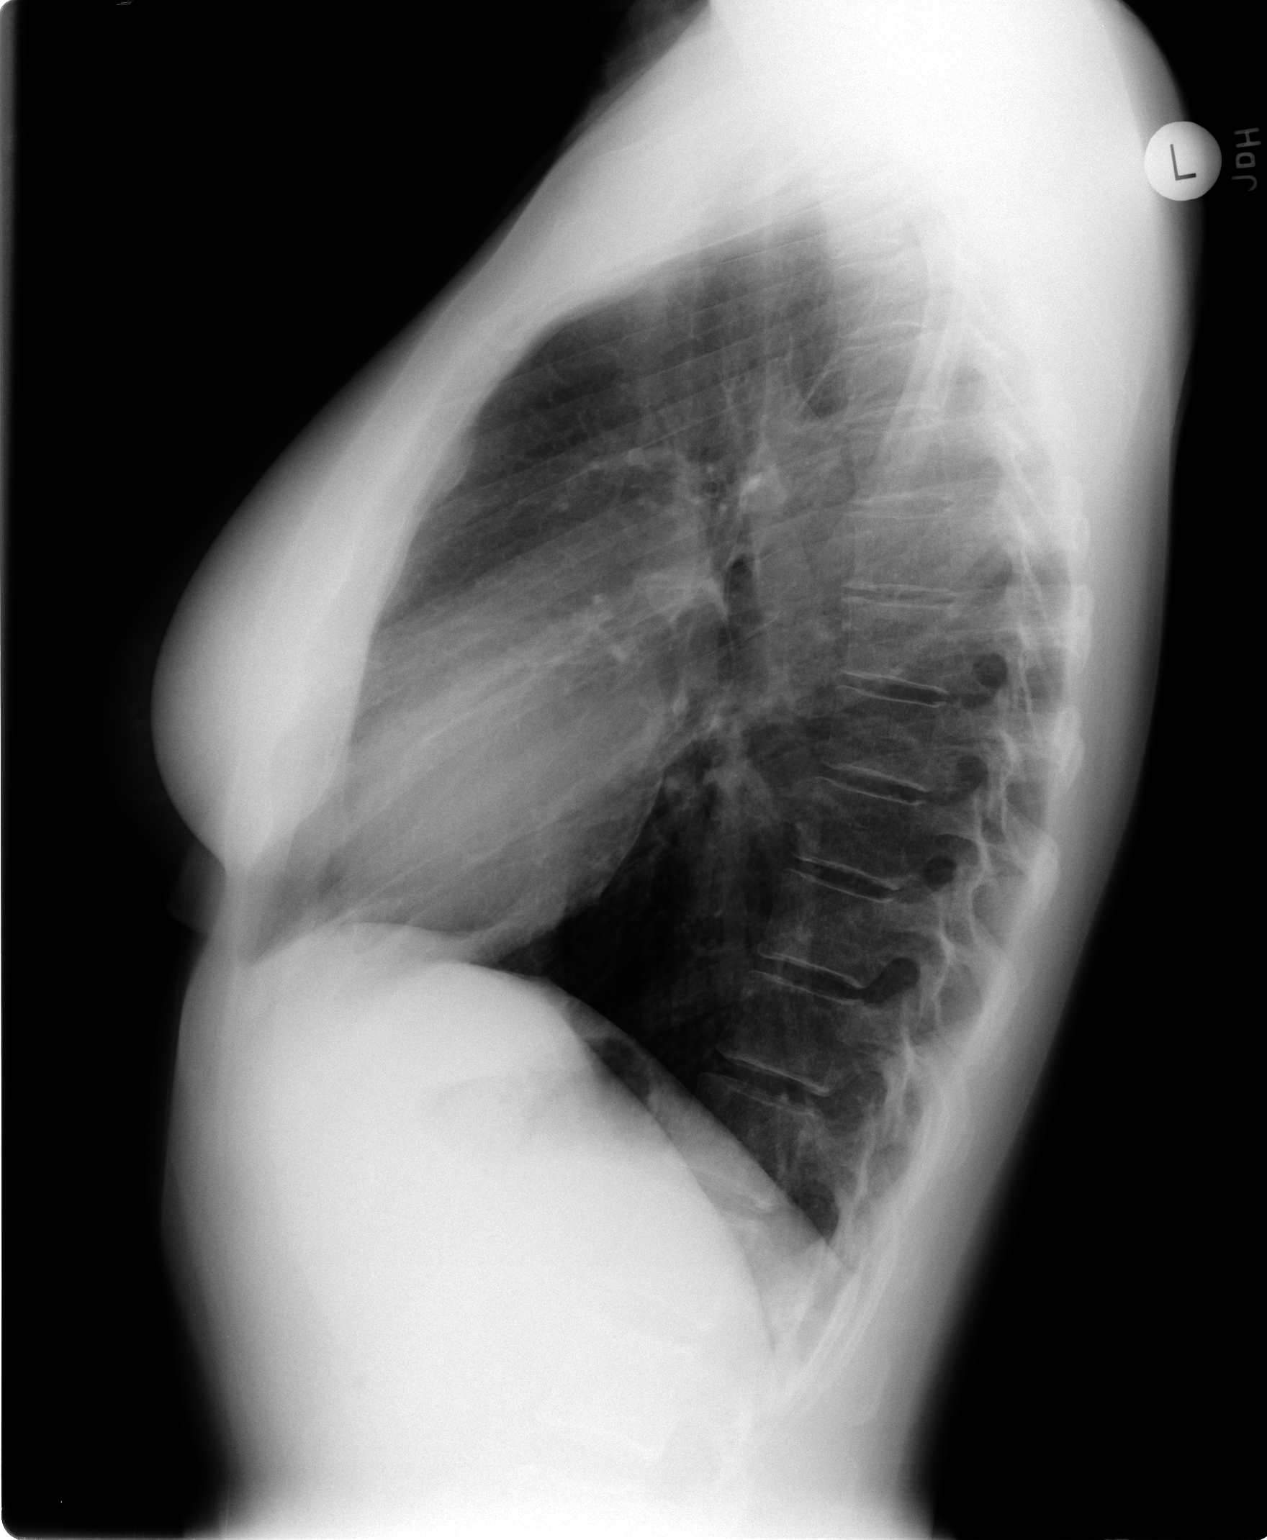

[2 of 2 positions shown; findings below may reference images not displayed]

FINDINGS: Cardiomediastinal silhouette is unremarkable. No acute infiltrate or
pleural effusion. No pulmonary edema. Bony thorax is unremarkable.
IMPRESSION: No active cardiopulmonary disease.

## 2015-12-08 DIAGNOSIS — J301 Allergic rhinitis due to pollen: Secondary | ICD-10-CM | POA: Diagnosis not present

## 2015-12-08 DIAGNOSIS — J3089 Other allergic rhinitis: Secondary | ICD-10-CM | POA: Diagnosis not present

## 2015-12-08 DIAGNOSIS — J3081 Allergic rhinitis due to animal (cat) (dog) hair and dander: Secondary | ICD-10-CM | POA: Diagnosis not present

## 2015-12-14 DIAGNOSIS — J3089 Other allergic rhinitis: Secondary | ICD-10-CM | POA: Diagnosis not present

## 2015-12-14 DIAGNOSIS — J301 Allergic rhinitis due to pollen: Secondary | ICD-10-CM | POA: Diagnosis not present

## 2015-12-14 DIAGNOSIS — J3081 Allergic rhinitis due to animal (cat) (dog) hair and dander: Secondary | ICD-10-CM | POA: Diagnosis not present

## 2015-12-18 DIAGNOSIS — J301 Allergic rhinitis due to pollen: Secondary | ICD-10-CM | POA: Diagnosis not present

## 2015-12-18 DIAGNOSIS — J3081 Allergic rhinitis due to animal (cat) (dog) hair and dander: Secondary | ICD-10-CM | POA: Diagnosis not present

## 2015-12-19 DIAGNOSIS — R061 Stridor: Secondary | ICD-10-CM | POA: Diagnosis not present

## 2015-12-19 DIAGNOSIS — J3089 Other allergic rhinitis: Secondary | ICD-10-CM | POA: Diagnosis not present

## 2015-12-19 DIAGNOSIS — J309 Allergic rhinitis, unspecified: Secondary | ICD-10-CM | POA: Diagnosis not present

## 2015-12-19 DIAGNOSIS — J386 Stenosis of larynx: Secondary | ICD-10-CM | POA: Diagnosis not present

## 2015-12-21 DIAGNOSIS — J3081 Allergic rhinitis due to animal (cat) (dog) hair and dander: Secondary | ICD-10-CM | POA: Diagnosis not present

## 2015-12-21 DIAGNOSIS — J3089 Other allergic rhinitis: Secondary | ICD-10-CM | POA: Diagnosis not present

## 2015-12-21 DIAGNOSIS — J301 Allergic rhinitis due to pollen: Secondary | ICD-10-CM | POA: Diagnosis not present

## 2015-12-25 DIAGNOSIS — Z01419 Encounter for gynecological examination (general) (routine) without abnormal findings: Secondary | ICD-10-CM | POA: Diagnosis not present

## 2015-12-25 DIAGNOSIS — Z6824 Body mass index (BMI) 24.0-24.9, adult: Secondary | ICD-10-CM | POA: Diagnosis not present

## 2015-12-25 DIAGNOSIS — Z0000 Encounter for general adult medical examination without abnormal findings: Secondary | ICD-10-CM | POA: Diagnosis not present

## 2015-12-25 DIAGNOSIS — Z1231 Encounter for screening mammogram for malignant neoplasm of breast: Secondary | ICD-10-CM | POA: Diagnosis not present

## 2015-12-25 DIAGNOSIS — E282 Polycystic ovarian syndrome: Secondary | ICD-10-CM | POA: Diagnosis not present

## 2015-12-25 DIAGNOSIS — Z1389 Encounter for screening for other disorder: Secondary | ICD-10-CM | POA: Diagnosis not present

## 2015-12-25 DIAGNOSIS — Z130 Encounter for screening for diseases of the blood and blood-forming organs and certain disorders involving the immune mechanism: Secondary | ICD-10-CM | POA: Diagnosis not present

## 2015-12-28 DIAGNOSIS — J301 Allergic rhinitis due to pollen: Secondary | ICD-10-CM | POA: Diagnosis not present

## 2015-12-28 DIAGNOSIS — J3081 Allergic rhinitis due to animal (cat) (dog) hair and dander: Secondary | ICD-10-CM | POA: Diagnosis not present

## 2015-12-28 DIAGNOSIS — J3089 Other allergic rhinitis: Secondary | ICD-10-CM | POA: Diagnosis not present

## 2016-01-03 ENCOUNTER — Other Ambulatory Visit: Payer: Self-pay | Admitting: Otolaryngology

## 2016-01-05 DIAGNOSIS — J3081 Allergic rhinitis due to animal (cat) (dog) hair and dander: Secondary | ICD-10-CM | POA: Diagnosis not present

## 2016-01-05 DIAGNOSIS — J3089 Other allergic rhinitis: Secondary | ICD-10-CM | POA: Diagnosis not present

## 2016-01-05 DIAGNOSIS — J301 Allergic rhinitis due to pollen: Secondary | ICD-10-CM | POA: Diagnosis not present

## 2016-01-11 DIAGNOSIS — J3081 Allergic rhinitis due to animal (cat) (dog) hair and dander: Secondary | ICD-10-CM | POA: Diagnosis not present

## 2016-01-11 DIAGNOSIS — J301 Allergic rhinitis due to pollen: Secondary | ICD-10-CM | POA: Diagnosis not present

## 2016-01-11 DIAGNOSIS — J3089 Other allergic rhinitis: Secondary | ICD-10-CM | POA: Diagnosis not present

## 2016-01-13 DIAGNOSIS — J020 Streptococcal pharyngitis: Secondary | ICD-10-CM | POA: Diagnosis not present

## 2016-01-15 NOTE — Pre-Procedure Instructions (Addendum)
Dois DavenportHeather S Weissinger  01/15/2016      [id number] [place], [place] - [address] [place] [id number] Phone: [phone] Fax: [phone]    Your procedure is scheduled on Monday, May 22.  [facility] 5:30A.M.   Your surgery or procedure is scheduled for 7:15 AM                 Call this number if you have problems the morning of surgery:[phone]                     For any other questions, please call [phone], Monday - Friday 8 AM - 4 PM.  Remember:  Do not eat food or drink liquids after midnight Sunday, May 21.  Take these medicines the morning of surgery with A SIP OF WATER :FLUoxetine (PROZAC), omeprazole (PRILOSEC), Birth Control Pill.   May use Flonase Nasal  Spray.             May use Albuterol Inhaler if needed and bring the inhaler with you to the hospital.   Do not wear jewelry, make-up or nail polish.  Do not wear lotions, powders, or perfumes.    Do not shave 48 hours prior to surgery.   Do not bring valuables to the hospital.  [facility] is not responsible for any belongings or valuables.  Contacts, dentures or bridgework may not be worn into surgery.  Leave your suitcase in the car.  After surgery it may be brought to your room.  For patients admitted to the hospital, discharge time will be determined by your treatment team.  Patients discharged the day of surgery will not be allowed to drive home.   Name and phone number of your driver:   -spouse  Special instructions:  Special Instructions: [place] - Preparing for Surgery  Before surgery, you can play an important role.  Because skin is not sterile, your skin needs to be as free of germs as possible.  You can reduce the number of germs on you skin by washing with CHG (chlorahexidine gluconate) soap before surgery.  CHG is an antiseptic cleaner which kills germs and bonds with the skin to continue killing germs even after washing.  Please  DO NOT use if you have an allergy to CHG or antibacterial soaps.  If your skin becomes reddened/irritated stop using the CHG and inform your nurse when you arrive at Short Stay.  Do not shave (including legs and underarms) for at least 48 hours prior to the first CHG shower.  You may shave your face.  Please follow these instructions carefully:   1.  Shower with CHG Soap the night before surgery and the  morning of Surgery.  2.  If you choose to wash your hair, wash your hair first as usual with your  normal shampoo.  3.  After you shampoo, rinse your hair and body thoroughly to remove the  Shampoo.  4.  Use CHG as you would any other liquid soap.  You can apply chg directly to the skin and wash gently with scrungie or a clean washcloth.  5.  Apply the CHG Soap to your body ONLY FROM THE NECK DOWN.    Do not use on open wounds or open sores.  Avoid contact with your eyes, ears, mouth and genitals (private parts).  Wash genitals (private parts)   with your normal soap.  6.  Wash  thoroughly, paying special attention to the area where your surgery will be performed.  7.  Thoroughly rinse your body with warm water from the neck down.  8.  DO NOT shower/wash with your normal soap after using and rinsing off   the CHG Soap.  9.  Pat yourself dry with a clean towel.            10.  Wear clean pajamas.            11.  Place clean sheets on your bed the night of your first shower and do not sleep with pets.  Day of Surgery  Do not apply any lotions/deodorants the morning of surgery.  Please wear clean clothes to the hospital/surgery center.  Please read over the following fact sheets that you were given. Pain Booklet, Coughing and Deep Breathing and Surgical Site Infection Prevention

## 2016-01-16 ENCOUNTER — Encounter (HOSPITAL_COMMUNITY): Payer: Self-pay

## 2016-01-16 ENCOUNTER — Encounter (HOSPITAL_COMMUNITY)
Admission: RE | Admit: 2016-01-16 | Discharge: 2016-01-16 | Disposition: A | Discharge status: Home / Self Care | Payer: BLUE CROSS/BLUE SHIELD | Source: Ambulatory Visit | Attending: Otolaryngology | Admitting: Otolaryngology

## 2016-01-16 DIAGNOSIS — J398 Other specified diseases of upper respiratory tract: Secondary | ICD-10-CM | POA: Insufficient documentation

## 2016-01-16 DIAGNOSIS — Z01812 Encounter for preprocedural laboratory examination: Secondary | ICD-10-CM | POA: Insufficient documentation

## 2016-01-16 HISTORY — DX: Unspecified asthma, uncomplicated: J45.909

## 2016-01-16 HISTORY — DX: Major depressive disorder, single episode, unspecified: F32.9

## 2016-01-16 HISTORY — DX: Anxiety disorder, unspecified: F41.9

## 2016-01-16 HISTORY — DX: Depression, unspecified: F32.A

## 2016-01-16 HISTORY — DX: Reserved for inherently not codable concepts without codable children: IMO0001

## 2016-01-16 HISTORY — DX: Other seasonal allergic rhinitis: J30.2

## 2016-01-16 HISTORY — DX: Other specified personal risk factors, not elsewhere classified: Z91.89

## 2016-01-16 LAB — CBC
HCT: 35 % — ABNORMAL LOW (ref 36.0–46.0)
Hemoglobin: 11.3 g/dL — ABNORMAL LOW (ref 12.0–15.0)
MCH: 28.9 pg (ref 26.0–34.0)
MCHC: 32.3 g/dL (ref 30.0–36.0)
MCV: 89.5 fL (ref 78.0–100.0)
PLATELETS: 385 10*3/uL (ref 150–400)
RBC: 3.91 MIL/uL (ref 3.87–5.11)
RDW: 12.4 % (ref 11.5–15.5)
WBC: 4.9 10*3/uL (ref 4.0–10.5)

## 2016-01-16 LAB — BASIC METABOLIC PANEL
Anion gap: 8 (ref 5–15)
BUN: 10 mg/dL (ref 6–20)
CALCIUM: 8.8 mg/dL — AB (ref 8.9–10.3)
CHLORIDE: 100 mmol/L — AB (ref 101–111)
CO2: 28 mmol/L (ref 22–32)
CREATININE: 0.7 mg/dL (ref 0.44–1.00)
GFR calc non Af Amer: >60 mL/min (ref >60)
Glucose, Bld: 101 mg/dL — ABNORMAL HIGH (ref 65–99)
Potassium: 3.5 mmol/L (ref 3.5–5.1)
SODIUM: 136 mmol/L (ref 135–145)

## 2016-01-16 LAB — HCG, SERUM, QUALITATIVE: PREG SERUM: NEGATIVE

## 2016-01-18 DIAGNOSIS — J3081 Allergic rhinitis due to animal (cat) (dog) hair and dander: Secondary | ICD-10-CM | POA: Diagnosis not present

## 2016-01-18 DIAGNOSIS — J3089 Other allergic rhinitis: Secondary | ICD-10-CM | POA: Diagnosis not present

## 2016-01-18 DIAGNOSIS — J301 Allergic rhinitis due to pollen: Secondary | ICD-10-CM | POA: Diagnosis not present

## 2016-01-22 ENCOUNTER — Ambulatory Visit (HOSPITAL_COMMUNITY): Payer: BLUE CROSS/BLUE SHIELD | Admitting: Anesthesiology

## 2016-01-22 ENCOUNTER — Encounter (HOSPITAL_COMMUNITY)
Admission: RE | Disposition: A | Discharge status: Home / Self Care | Payer: Self-pay | Source: Ambulatory Visit | Attending: Otolaryngology

## 2016-01-22 ENCOUNTER — Ambulatory Visit (HOSPITAL_COMMUNITY)
Admission: RE | Admit: 2016-01-22 | Discharge: 2016-01-22 | Disposition: A | Discharge status: Home / Self Care | Payer: BLUE CROSS/BLUE SHIELD | Source: Ambulatory Visit | Attending: Otolaryngology | Admitting: Otolaryngology

## 2016-01-22 DIAGNOSIS — J386 Stenosis of larynx: Secondary | ICD-10-CM | POA: Insufficient documentation

## 2016-01-22 DIAGNOSIS — D649 Anemia, unspecified: Secondary | ICD-10-CM | POA: Diagnosis not present

## 2016-01-22 DIAGNOSIS — J45909 Unspecified asthma, uncomplicated: Secondary | ICD-10-CM | POA: Diagnosis not present

## 2016-01-22 DIAGNOSIS — K219 Gastro-esophageal reflux disease without esophagitis: Secondary | ICD-10-CM | POA: Diagnosis not present

## 2016-01-22 HISTORY — PX: DIRECT LARYNGOSCOPY: SHX5326

## 2016-01-22 SURGERY — LARYNGOSCOPY, DIRECT
Anesthesia: General | Site: Throat

## 2016-01-22 MED ORDER — FENTANYL CITRATE (PF) 250 MCG/5ML IJ SOLN
INTRAMUSCULAR | Status: AC
  Filled 2016-01-22: qty 5

## 2016-01-22 MED ORDER — OXYCODONE HCL 5 MG PO TABS: 5.0000 mg | ORAL_TABLET | Freq: Once | ORAL | Status: DC | PRN

## 2016-01-22 MED ORDER — PHENYLEPHRINE HCL 10 MG/ML IJ SOLN
20.0000 mg | INTRAMUSCULAR | Status: DC | PRN
Start: 2016-01-22 — End: 2016-01-22
  Administered 2016-01-22: 5 ug/min via INTRAVENOUS

## 2016-01-22 MED ORDER — OXYCODONE HCL 5 MG/5ML PO SOLN: 5.0000 mg | Freq: Once | ORAL | Status: DC | PRN

## 2016-01-22 MED ORDER — ROCURONIUM BROMIDE 50 MG/5ML IV SOLN
INTRAVENOUS | Status: AC
  Filled 2016-01-22: qty 1

## 2016-01-22 MED ORDER — LIDOCAINE 2% (20 MG/ML) 5 ML SYRINGE
INTRAMUSCULAR | Status: AC
  Filled 2016-01-22: qty 5

## 2016-01-22 MED ORDER — PROPOFOL 10 MG/ML IV BOLUS
INTRAVENOUS | Status: AC
  Filled 2016-01-22: qty 20

## 2016-01-22 MED ORDER — OXYMETAZOLINE HCL 0.05 % NA SOLN
NASAL | Status: AC
  Filled 2016-01-22: qty 15

## 2016-01-22 MED ORDER — EPINEPHRINE HCL (NASAL) 0.1 % NA SOLN
NASAL | Status: AC
  Filled 2016-01-22: qty 30

## 2016-01-22 MED ORDER — FENTANYL CITRATE (PF) 100 MCG/2ML IJ SOLN: 25.0000 ug | INTRAMUSCULAR | Status: DC | PRN

## 2016-01-22 MED ORDER — MIDAZOLAM HCL 2 MG/2ML IJ SOLN
INTRAMUSCULAR | Status: AC
  Filled 2016-01-22: qty 2

## 2016-01-22 MED ORDER — LACTATED RINGERS IV SOLN
INTRAVENOUS | Status: DC | PRN
  Administered 2016-01-22: 08:00:00 via INTRAVENOUS

## 2016-01-22 MED ORDER — ACETAMINOPHEN 325 MG PO TABS
325.0000 mg | ORAL_TABLET | ORAL | Status: DC | PRN
Start: 2016-01-22 — End: 2016-01-22

## 2016-01-22 MED ORDER — MIDAZOLAM HCL 5 MG/5ML IJ SOLN
INTRAMUSCULAR | Status: DC | PRN
  Administered 2016-01-22: 2 mg via INTRAVENOUS

## 2016-01-22 MED ORDER — DEXAMETHASONE SODIUM PHOSPHATE 10 MG/ML IJ SOLN
INTRAMUSCULAR | Status: DC | PRN
  Administered 2016-01-22: 10 mg via INTRAVENOUS

## 2016-01-22 MED ORDER — EPINEPHRINE HCL (NASAL) 0.1 % NA SOLN
NASAL | Status: DC | PRN
  Administered 2016-01-22: 30 mL via NASAL

## 2016-01-22 MED ORDER — ONDANSETRON HCL 4 MG/2ML IJ SOLN
INTRAMUSCULAR | Status: AC
  Filled 2016-01-22: qty 2

## 2016-01-22 MED ORDER — EPHEDRINE 5 MG/ML INJ
INTRAVENOUS | Status: AC
  Filled 2016-01-22: qty 10

## 2016-01-22 MED ORDER — PHENYLEPHRINE 40 MCG/ML (10ML) SYRINGE FOR IV PUSH (FOR BLOOD PRESSURE SUPPORT)
PREFILLED_SYRINGE | INTRAVENOUS | Status: AC
  Filled 2016-01-22: qty 10

## 2016-01-22 MED ORDER — SUCCINYLCHOLINE CHLORIDE 200 MG/10ML IV SOSY
PREFILLED_SYRINGE | INTRAVENOUS | Status: AC
  Filled 2016-01-22: qty 10

## 2016-01-22 MED ORDER — SODIUM CHLORIDE 0.9 % IV SOLN
0.0125 ug/kg/min | INTRAVENOUS | Status: AC
  Administered 2016-01-22: .4 ug/kg/min via INTRAVENOUS
  Filled 2016-01-22: qty 2000

## 2016-01-22 MED ORDER — ACETAMINOPHEN 160 MG/5ML PO SOLN
325.0000 mg | ORAL | Status: DC | PRN
  Filled 2016-01-22: qty 20.3

## 2016-01-22 MED ORDER — PROPOFOL 500 MG/50ML IV EMUL
INTRAVENOUS | Status: DC | PRN
  Administered 2016-01-22: 125 ug/kg/min via INTRAVENOUS

## 2016-01-22 MED ORDER — LIDOCAINE 2% (20 MG/ML) 5 ML SYRINGE
INTRAMUSCULAR | Status: AC
  Filled 2016-01-22: qty 10

## 2016-01-22 SURGICAL SUPPLY — 33 items
BALLN PULM 15 16.5 18 X 75CM (BALLOONS) ×1
BALLN PULM 15 16.5 18X75 (BALLOONS) ×2
BALLOON PULM 15 16.5 18X75 (BALLOONS) IMPLANT
CANISTER SUCTION 2500CC (MISCELLANEOUS) ×3 IMPLANT
CONT SPEC 4OZ CLIKSEAL STRL BL (MISCELLANEOUS) IMPLANT
COVER MAYO STAND STRL (DRAPES) ×3 IMPLANT
COVER TABLE BACK 60X90 (DRAPES) ×3 IMPLANT
CRADLE DONUT ADULT HEAD (MISCELLANEOUS) IMPLANT
DECANTER SPIKE VIAL GLASS SM (MISCELLANEOUS) ×2 IMPLANT
DRAPE PROXIMA HALF (DRAPES) ×3 IMPLANT
GAUZE SPONGE 4X4 16PLY XRAY LF (GAUZE/BANDAGES/DRESSINGS) ×3 IMPLANT
GLOVE BIO SURGEON STRL SZ7.5 (GLOVE) ×5 IMPLANT
GLOVE BIOGEL PI IND STRL 7.0 (GLOVE) IMPLANT
GLOVE BIOGEL PI INDICATOR 7.0 (GLOVE) ×4
GLOVE SURG SS PI 7.0 STRL IVOR (GLOVE) ×2 IMPLANT
GOWN STRL REUS W/ TWL LRG LVL3 (GOWN DISPOSABLE) IMPLANT
GOWN STRL REUS W/TWL LRG LVL3 (GOWN DISPOSABLE)
GUARD TEETH (MISCELLANEOUS) ×3 IMPLANT
KIT ROOM TURNOVER OR (KITS) ×3 IMPLANT
NDL HYPO 25GX1X1/2 BEV (NEEDLE) IMPLANT
NDL TRANS ORAL INJECTION (NEEDLE) IMPLANT
NEEDLE HYPO 25GX1X1/2 BEV (NEEDLE) IMPLANT
NEEDLE TRANS ORAL INJECTION (NEEDLE) IMPLANT
NS IRRIG 1000ML POUR BTL (IV SOLUTION) ×3 IMPLANT
PAD ARMBOARD 7.5X6 YLW CONV (MISCELLANEOUS) ×6 IMPLANT
PATTIES SURGICAL .5 X3 (DISPOSABLE) ×2 IMPLANT
SOLUTION ANTI FOG 6CC (MISCELLANEOUS) ×2 IMPLANT
SPONGE GAUZE 4X4 12PLY STER LF (GAUZE/BANDAGES/DRESSINGS) ×2 IMPLANT
SURGILUBE 2OZ TUBE FLIPTOP (MISCELLANEOUS) ×2 IMPLANT
SYR INFLATE BILIARY GAUGE (MISCELLANEOUS) ×2 IMPLANT
TOWEL OR 17X24 6PK STRL BLUE (TOWEL DISPOSABLE) ×6 IMPLANT
TUBE CONNECTING 12'X1/4 (SUCTIONS) ×1
TUBE CONNECTING 12X1/4 (SUCTIONS) ×2 IMPLANT

## 2016-01-22 NOTE — Anesthesia Preprocedure Evaluation (Signed)
Anesthesia Evaluation  Patient identified by MRN, date of birth, ID band Patient awake    Reviewed: Allergy & Precautions, NPO status , Patient's Chart, lab work & pertinent test results  History of Anesthesia Complications Negative for: history of anesthetic complications  Airway Mallampati: II  TM Distance: >3 FB Neck ROM: Full    Dental  (+) Teeth Intact,    Pulmonary asthma , neg sleep apnea, neg recent URI, neg PE   breath sounds clear to auscultation       Cardiovascular negative cardio ROS   Rhythm:Regular     Neuro/Psych PSYCHIATRIC DISORDERS Anxiety Depression negative neurological ROS     GI/Hepatic Neg liver ROS, GERD  Medicated and Controlled,  Endo/Other    Renal/GU negative Renal ROS     Musculoskeletal   Abdominal   Peds  Hematology  (+) anemia ,   Anesthesia Other Findings   Reproductive/Obstetrics                             Anesthesia Physical Anesthesia Plan  ASA: II  Anesthesia Plan: General   Post-op Pain Management:    Induction: Intravenous  Airway Management Planned:   Additional Equipment: None  Intra-op Plan:   Post-operative Plan:   Informed Consent: I have reviewed the patients History and Physical, chart, labs and discussed the procedure including the risks, benefits and alternatives for the proposed anesthesia with the patient or authorized representative who has indicated his/her understanding and acceptance.   Dental advisory given  Plan Discussed with: CRNA and Surgeon  Anesthesia Plan Comments: (Jet ventilation planned)        Anesthesia Quick Evaluation

## 2016-01-22 NOTE — Transfer of Care (Signed)
Immediate Anesthesia Transfer of Care Note  Patient: Denise DavenportHeather S Barr  Procedure(s) Performed: Procedure(s) with comments: DIRECT LARYNGOSCOPY WITH CO2 Laser  (N/A) - Micro Direct Laryngoscopy with Jet Ventilation  Patient Location: PACU  Anesthesia Type:General  Level of Consciousness: awake, alert , oriented and patient cooperative  Airway & Oxygen Therapy: Patient Spontanous Breathing and Patient connected to nasal cannula oxygen  Post-op Assessment: Report given to RN, Post -op Vital signs reviewed and stable and Patient moving all extremities  Post vital signs: Reviewed and stable  Last Vitals:  Filed Vitals:   01/22/16 0610  BP: 128/108  Pulse: 80  Temp: 36.8 C  Resp: 16    Last Pain: There were no vitals filed for this visit.       Complications: No apparent anesthesia complications

## 2016-01-22 NOTE — H&P (Signed)
Denise Barr is an 45 y.o. female.   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  Difficulty breathing has worsened gradually to where she can't exercise like she wants to.  She presents for surgical management.  Past Medical History  Diagnosis Date  . 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 GERD (gastroesophageal reflux disease)     occasional   . Anemia april 2011    with pregnancy only  . Eczema   . Seasonal allergies     uses allergy shots  . Asthma     PCP- told pt. that she has exercise induced asthma   . Shortness of breath dyspnea     when walking upstairs   . Depression     situational - told that she can wean off Prozac- in the summer- 2017  . Anxiety   . History of fainting     pt. reports that she tends to faint easily     Past Surgical History  Procedure Laterality Date  . Wisdom tooth extraction  1995  . Epidural block injection      x 3 with childbirth  . Esophagogastroduodenoscopy N/A 10/22/2013    Procedure: ESOPHAGOGASTRODUODENOSCOPY (EGD);  Surgeon: Theda Belfast, MD;  Location: [facility];  Service: Endoscopy;  Laterality: N/A;  . Bravo ph study N/A 10/22/2013    Procedure: BRAVO PH STUDY;  Surgeon: Theda Belfast, MD;  Location: [facility];  Service: Endoscopy;  Laterality: N/A;    No family history on file. Social History:  reports that she has never smoked. She has never used smokeless tobacco. She reports that she drinks alcohol. She reports that she does not use illicit drugs.  Allergies: No Known Allergies  Medications Prior to Admission  Medication Sig Dispense Refill  . albuterol (PROVENTIL HFA;VENTOLIN HFA) 108 (90 BASE) MCG/ACT inhaler Inhale 1 puff into the lungs every 6 (six) hours as needed for wheezing or shortness of breath.    Marland Kitchen amoxicillin (AMOXIL) 500 MG capsule Take 500 mg by mouth 2 (two) times daily. Day 10 is 01/22/2016     . FLUoxetine (PROZAC) 10 MG capsule Take 10 mg by mouth at bedtime.   0  . fluticasone (FLONASE) 50 MCG/ACT nasal spray Place 1 spray into both nostrils daily.    Marland Kitchen levocetirizine (XYZAL) 5 MG tablet Take 5 mg by mouth every evening.  5  . norgestimate-ethinyl estradiol (ORTHO-CYCLEN,SPRINTEC,PREVIFEM) 0.25-35 MG-MCG tablet Take 1 tablet by mouth daily.    Marland Kitchen omeprazole (PRILOSEC) 40 MG capsule Take 40 mg by mouth daily.       No results found for this or any previous visit (from the past 48 hour(s)). No results found.  Review of Systems  Respiratory: Positive for shortness of breath.   All other systems reviewed and are negative.   Blood pressure 128/108, pulse 80, temperature 98.3 F (36.8 C), temperature source Oral, resp. rate 16, last menstrual period 01/09/2016, SpO2 100 %. Physical Exam  Constitutional: She is oriented to person, place, and time. She appears well-developed and well-nourished. No distress.  HENT:  Head: Normocephalic and atraumatic.  Right Ear: External ear normal.  Left Ear: External ear normal.  Nose: Nose normal.  Mouth/Throat: Oropharynx is clear and moist.  Eyes: Conjunctivae and EOM are normal. Pupils are equal, round, and reactive to light.  Neck: Normal range of motion. Neck supple.  Cardiovascular: Normal rate.   Respiratory: Effort normal.  Musculoskeletal: Normal range of motion.  Neurological: She is alert and oriented to person, place, and time. No cranial nerve deficit.  Skin: Skin is warm and dry.  Psychiatric: She has a normal mood and affect. Her behavior is normal. Judgment and thought content normal.     Assessment/Plan Subglottic stenosis To OR for SMDL with CO2 laser dilation.  Christia ReadingBATES, Haizlee Henton, MD 01/22/2016, 7:22 AM

## 2016-01-22 NOTE — Brief Op Note (Signed)
01/22/2016  8:14 AM  PATIENT:  Denise RanaHeather S Barr  45 y.o. female  PRE-OPERATIVE DIAGNOSIS: subglottic stenosis  POST-OPERATIVE DIAGNOSIS:  same  PROCEDURE:  Procedure(s) with comments: DIRECT LARYNGOSCOPY (N/A) - Micro Direct Laryngoscopy with Jet Ventilation with CO2 laser dilation  SURGEON:  Surgeon(s) and Role:    * Christia Readingwight Aaiden Depoy, MD - Primary  PHYSICIAN ASSISTANT:   ASSISTANTS: none   ANESTHESIA:   general  EBL:     BLOOD ADMINISTERED:none  DRAINS: none   LOCAL MEDICATIONS USED:  NONE  SPECIMEN:  No Specimen  DISPOSITION OF SPECIMEN:  N/A  COUNTS:  YES  TOURNIQUET:  * No tourniquets in log *  DICTATION: .Other Dictation: Dictation Number [id number]  PLAN OF CARE: Discharge to home after PACU  PATIENT DISPOSITION:  PACU - hemodynamically stable.   Delay start of Pharmacological VTE agent (>24hrs) due to surgical blood loss or risk of bleeding: no

## 2016-01-22 NOTE — Op Note (Signed)
NAMPhilipp Ovens:  Kullman, Alexsus                 ACCOUNT NO.:  [id number]  MEDICAL RECORD NO.:  [id number]  LOCATION:  [facility]                         FACILITY:  [facility]  PHYSICIAN:  Antony Contraswight D Ameliarose Shark, MD     DATE OF BIRTH:  1970-11-20  DATE OF PROCEDURE:  01/22/2016 DATE OF DISCHARGE:                              OPERATIVE REPORT   PREOPERATIVE DIAGNOSIS:  Subglottic stenosis.  POSTOPERATIVE DIAGNOSIS:  Subglottic stenosis.  PROCEDURE:  Suspended microdirect laryngoscopy with CO2 laser dilation.  SURGEON:  Antony Contraswight D Zvi Duplantis, MD  ANESTHESIA:  General jet Venturi ventilation.  COMPLICATIONS:  None.  INDICATION:  The patient is a 45 year old female who has a few year history of worsening dyspnea on exertion, found to be due to subglottic stenosis.  She has been treated for reflux over the last couple of years but has had slow progression of the problem to where she is now unable to exercise.  Thus, she presents to the operating room for surgical management.  FINDINGS:  There was a 50% circumferential subglottic stenosis of short height.  DESCRIPTION OF PROCEDURE:  The patient was identified in the holding room, informed consent having been obtained with discussion of risks, benefits, and alternatives.  The patient was brought to the operative suite and put on the table in supine position.  Anesthesia was induced, and the patient was maintained via mask ventilation.  The eyes taped closed and bed was turned 90 degrees from anesthesia.  The patient was given intravenous steroids during the case.  A tooth guard was placed over the upper teeth, and a Storz laryngoscope was placed in this in the glottic position and suspended to the Mayo stand using a Lewy arm.  Jet Venturi ventilation was initiated.  Damp eye pads were taped over the eyes and a damp towel was placed over the face.  A preoperative photograph was made with a 0-degree telescope.  The operating microscope with CO2 laser was then brought  into the field, and radial incisions were made at 12 o'clock, 4 o'clock, and 8 o'clock using the laser on 8 watts continuous.  Afrin soaked pledgets were used to remove char.  The largest tracheal balloon was then placed into the airway and inflated to 7 atmospheres of pressure for 60 seconds and removed, and then done a second time.  After this, the airway was suctioned and sprayed with topical 2% lidocaine. The laryngoscope was taken out of suspension and removed from the patient's mouth while suctioning the airway.  She was returned to mask ventilation after removing the tooth guard and was then returned to Anesthesia for wake up.  She was moved to recovery room in stable condition.     Antony Contraswight D Avyaan Summer, MD     DDB/MEDQ  D:  01/22/2016  T:  01/22/2016  Job:  [id number]  cc:   Dr. Christia Readingwight Rashon Rezek' Office

## 2016-01-23 ENCOUNTER — Encounter (HOSPITAL_COMMUNITY): Payer: Self-pay | Admitting: Otolaryngology

## 2016-01-23 NOTE — Anesthesia Postprocedure Evaluation (Signed)
Anesthesia Post Note  Patient: Denise RanaHeather S Barr  Procedure(s) Performed: Procedure(s) (LRB): DIRECT LARYNGOSCOPY WITH CO2 Laser  (N/A)  Patient location during evaluation: PACU Anesthesia Type: General Level of consciousness: awake Pain management: pain level controlled Vital Signs Assessment: post-procedure vital signs reviewed and stable Respiratory status: spontaneous breathing Cardiovascular status: stable Postop Assessment: no signs of nausea or vomiting Anesthetic complications: no    Last Vitals:  Filed Vitals:   01/22/16 0900 01/22/16 0909  BP: 134/79 135/78  Pulse: 78 74  Temp: 36.6 C   Resp: 18     Last Pain:  Filed Vitals:   01/22/16 0912  PainSc: 0-No pain                 Kemoni Ortega

## 2016-01-24 DIAGNOSIS — J301 Allergic rhinitis due to pollen: Secondary | ICD-10-CM | POA: Diagnosis not present

## 2016-01-24 DIAGNOSIS — J3081 Allergic rhinitis due to animal (cat) (dog) hair and dander: Secondary | ICD-10-CM | POA: Diagnosis not present

## 2016-01-24 DIAGNOSIS — J3089 Other allergic rhinitis: Secondary | ICD-10-CM | POA: Diagnosis not present

## 2016-02-02 DIAGNOSIS — F419 Anxiety disorder, unspecified: Secondary | ICD-10-CM | POA: Diagnosis not present

## 2016-02-07 DIAGNOSIS — J3089 Other allergic rhinitis: Secondary | ICD-10-CM | POA: Diagnosis not present

## 2016-02-07 DIAGNOSIS — J301 Allergic rhinitis due to pollen: Secondary | ICD-10-CM | POA: Diagnosis not present

## 2016-02-07 DIAGNOSIS — J3081 Allergic rhinitis due to animal (cat) (dog) hair and dander: Secondary | ICD-10-CM | POA: Diagnosis not present

## 2016-02-15 DIAGNOSIS — J3081 Allergic rhinitis due to animal (cat) (dog) hair and dander: Secondary | ICD-10-CM | POA: Diagnosis not present

## 2016-02-15 DIAGNOSIS — J301 Allergic rhinitis due to pollen: Secondary | ICD-10-CM | POA: Diagnosis not present

## 2016-02-15 DIAGNOSIS — J3089 Other allergic rhinitis: Secondary | ICD-10-CM | POA: Diagnosis not present

## 2016-02-22 DIAGNOSIS — J3089 Other allergic rhinitis: Secondary | ICD-10-CM | POA: Diagnosis not present

## 2016-02-22 DIAGNOSIS — J3081 Allergic rhinitis due to animal (cat) (dog) hair and dander: Secondary | ICD-10-CM | POA: Diagnosis not present

## 2016-02-22 DIAGNOSIS — J301 Allergic rhinitis due to pollen: Secondary | ICD-10-CM | POA: Diagnosis not present

## 2016-03-01 DIAGNOSIS — J3089 Other allergic rhinitis: Secondary | ICD-10-CM | POA: Diagnosis not present

## 2016-03-01 DIAGNOSIS — J3081 Allergic rhinitis due to animal (cat) (dog) hair and dander: Secondary | ICD-10-CM | POA: Diagnosis not present

## 2016-03-01 DIAGNOSIS — J301 Allergic rhinitis due to pollen: Secondary | ICD-10-CM | POA: Diagnosis not present

## 2016-03-22 DIAGNOSIS — J3081 Allergic rhinitis due to animal (cat) (dog) hair and dander: Secondary | ICD-10-CM | POA: Diagnosis not present

## 2016-03-22 DIAGNOSIS — J3089 Other allergic rhinitis: Secondary | ICD-10-CM | POA: Diagnosis not present

## 2016-03-22 DIAGNOSIS — J301 Allergic rhinitis due to pollen: Secondary | ICD-10-CM | POA: Diagnosis not present

## 2016-03-29 DIAGNOSIS — J3081 Allergic rhinitis due to animal (cat) (dog) hair and dander: Secondary | ICD-10-CM | POA: Diagnosis not present

## 2016-03-29 DIAGNOSIS — J301 Allergic rhinitis due to pollen: Secondary | ICD-10-CM | POA: Diagnosis not present

## 2016-03-29 DIAGNOSIS — J3089 Other allergic rhinitis: Secondary | ICD-10-CM | POA: Diagnosis not present

## 2016-04-04 DIAGNOSIS — J3081 Allergic rhinitis due to animal (cat) (dog) hair and dander: Secondary | ICD-10-CM | POA: Diagnosis not present

## 2016-04-04 DIAGNOSIS — J3089 Other allergic rhinitis: Secondary | ICD-10-CM | POA: Diagnosis not present

## 2016-04-04 DIAGNOSIS — J301 Allergic rhinitis due to pollen: Secondary | ICD-10-CM | POA: Diagnosis not present

## 2016-04-17 DIAGNOSIS — J3081 Allergic rhinitis due to animal (cat) (dog) hair and dander: Secondary | ICD-10-CM | POA: Diagnosis not present

## 2016-04-17 DIAGNOSIS — J3089 Other allergic rhinitis: Secondary | ICD-10-CM | POA: Diagnosis not present

## 2016-04-17 DIAGNOSIS — J301 Allergic rhinitis due to pollen: Secondary | ICD-10-CM | POA: Diagnosis not present

## 2016-05-02 DIAGNOSIS — J3089 Other allergic rhinitis: Secondary | ICD-10-CM | POA: Diagnosis not present

## 2016-05-02 DIAGNOSIS — J3081 Allergic rhinitis due to animal (cat) (dog) hair and dander: Secondary | ICD-10-CM | POA: Diagnosis not present

## 2016-05-02 DIAGNOSIS — J301 Allergic rhinitis due to pollen: Secondary | ICD-10-CM | POA: Diagnosis not present

## 2016-05-09 DIAGNOSIS — J3089 Other allergic rhinitis: Secondary | ICD-10-CM | POA: Diagnosis not present

## 2016-05-09 DIAGNOSIS — J3081 Allergic rhinitis due to animal (cat) (dog) hair and dander: Secondary | ICD-10-CM | POA: Diagnosis not present

## 2016-05-09 DIAGNOSIS — J301 Allergic rhinitis due to pollen: Secondary | ICD-10-CM | POA: Diagnosis not present

## 2016-05-16 DIAGNOSIS — J3089 Other allergic rhinitis: Secondary | ICD-10-CM | POA: Diagnosis not present

## 2016-05-16 DIAGNOSIS — J3081 Allergic rhinitis due to animal (cat) (dog) hair and dander: Secondary | ICD-10-CM | POA: Diagnosis not present

## 2016-05-16 DIAGNOSIS — J301 Allergic rhinitis due to pollen: Secondary | ICD-10-CM | POA: Diagnosis not present

## 2016-05-23 DIAGNOSIS — J3089 Other allergic rhinitis: Secondary | ICD-10-CM | POA: Diagnosis not present

## 2016-05-23 DIAGNOSIS — J3081 Allergic rhinitis due to animal (cat) (dog) hair and dander: Secondary | ICD-10-CM | POA: Diagnosis not present

## 2016-05-23 DIAGNOSIS — J301 Allergic rhinitis due to pollen: Secondary | ICD-10-CM | POA: Diagnosis not present

## 2016-05-28 DIAGNOSIS — L814 Other melanin hyperpigmentation: Secondary | ICD-10-CM | POA: Diagnosis not present

## 2016-05-28 DIAGNOSIS — R0602 Shortness of breath: Secondary | ICD-10-CM | POA: Diagnosis not present

## 2016-05-28 DIAGNOSIS — Z86018 Personal history of other benign neoplasm: Secondary | ICD-10-CM | POA: Diagnosis not present

## 2016-05-28 DIAGNOSIS — J3081 Allergic rhinitis due to animal (cat) (dog) hair and dander: Secondary | ICD-10-CM | POA: Diagnosis not present

## 2016-05-28 DIAGNOSIS — D1801 Hemangioma of skin and subcutaneous tissue: Secondary | ICD-10-CM | POA: Diagnosis not present

## 2016-05-28 DIAGNOSIS — D225 Melanocytic nevi of trunk: Secondary | ICD-10-CM | POA: Diagnosis not present

## 2016-05-28 DIAGNOSIS — J301 Allergic rhinitis due to pollen: Secondary | ICD-10-CM | POA: Diagnosis not present

## 2016-05-28 DIAGNOSIS — J3089 Other allergic rhinitis: Secondary | ICD-10-CM | POA: Diagnosis not present

## 2016-05-29 DIAGNOSIS — J3089 Other allergic rhinitis: Secondary | ICD-10-CM | POA: Diagnosis not present

## 2016-05-29 DIAGNOSIS — J3081 Allergic rhinitis due to animal (cat) (dog) hair and dander: Secondary | ICD-10-CM | POA: Diagnosis not present

## 2016-05-29 DIAGNOSIS — J301 Allergic rhinitis due to pollen: Secondary | ICD-10-CM | POA: Diagnosis not present

## 2016-06-06 DIAGNOSIS — J3081 Allergic rhinitis due to animal (cat) (dog) hair and dander: Secondary | ICD-10-CM | POA: Diagnosis not present

## 2016-06-06 DIAGNOSIS — J3089 Other allergic rhinitis: Secondary | ICD-10-CM | POA: Diagnosis not present

## 2016-06-06 DIAGNOSIS — J301 Allergic rhinitis due to pollen: Secondary | ICD-10-CM | POA: Diagnosis not present

## 2016-06-13 DIAGNOSIS — J301 Allergic rhinitis due to pollen: Secondary | ICD-10-CM | POA: Diagnosis not present

## 2016-06-13 DIAGNOSIS — J3089 Other allergic rhinitis: Secondary | ICD-10-CM | POA: Diagnosis not present

## 2016-06-13 DIAGNOSIS — J3081 Allergic rhinitis due to animal (cat) (dog) hair and dander: Secondary | ICD-10-CM | POA: Diagnosis not present

## 2016-06-20 DIAGNOSIS — J301 Allergic rhinitis due to pollen: Secondary | ICD-10-CM | POA: Diagnosis not present

## 2016-06-20 DIAGNOSIS — J3081 Allergic rhinitis due to animal (cat) (dog) hair and dander: Secondary | ICD-10-CM | POA: Diagnosis not present

## 2016-06-20 DIAGNOSIS — J3089 Other allergic rhinitis: Secondary | ICD-10-CM | POA: Diagnosis not present

## 2016-06-25 DIAGNOSIS — J3089 Other allergic rhinitis: Secondary | ICD-10-CM | POA: Diagnosis not present

## 2016-06-27 DIAGNOSIS — J3089 Other allergic rhinitis: Secondary | ICD-10-CM | POA: Diagnosis not present

## 2016-06-27 DIAGNOSIS — J301 Allergic rhinitis due to pollen: Secondary | ICD-10-CM | POA: Diagnosis not present

## 2016-06-27 DIAGNOSIS — J3081 Allergic rhinitis due to animal (cat) (dog) hair and dander: Secondary | ICD-10-CM | POA: Diagnosis not present

## 2016-07-02 DIAGNOSIS — J301 Allergic rhinitis due to pollen: Secondary | ICD-10-CM | POA: Diagnosis not present

## 2016-07-02 DIAGNOSIS — J3081 Allergic rhinitis due to animal (cat) (dog) hair and dander: Secondary | ICD-10-CM | POA: Diagnosis not present

## 2016-07-11 DIAGNOSIS — J3089 Other allergic rhinitis: Secondary | ICD-10-CM | POA: Diagnosis not present

## 2016-07-11 DIAGNOSIS — J301 Allergic rhinitis due to pollen: Secondary | ICD-10-CM | POA: Diagnosis not present

## 2016-07-11 DIAGNOSIS — J3081 Allergic rhinitis due to animal (cat) (dog) hair and dander: Secondary | ICD-10-CM | POA: Diagnosis not present

## 2016-07-18 DIAGNOSIS — J301 Allergic rhinitis due to pollen: Secondary | ICD-10-CM | POA: Diagnosis not present

## 2016-07-18 DIAGNOSIS — J3089 Other allergic rhinitis: Secondary | ICD-10-CM | POA: Diagnosis not present

## 2016-07-18 DIAGNOSIS — J3081 Allergic rhinitis due to animal (cat) (dog) hair and dander: Secondary | ICD-10-CM | POA: Diagnosis not present

## 2016-08-01 DIAGNOSIS — J301 Allergic rhinitis due to pollen: Secondary | ICD-10-CM | POA: Diagnosis not present

## 2016-08-01 DIAGNOSIS — J3081 Allergic rhinitis due to animal (cat) (dog) hair and dander: Secondary | ICD-10-CM | POA: Diagnosis not present

## 2016-08-01 DIAGNOSIS — J3089 Other allergic rhinitis: Secondary | ICD-10-CM | POA: Diagnosis not present

## 2016-08-08 DIAGNOSIS — J301 Allergic rhinitis due to pollen: Secondary | ICD-10-CM | POA: Diagnosis not present

## 2016-08-08 DIAGNOSIS — J3089 Other allergic rhinitis: Secondary | ICD-10-CM | POA: Diagnosis not present

## 2016-08-08 DIAGNOSIS — J3081 Allergic rhinitis due to animal (cat) (dog) hair and dander: Secondary | ICD-10-CM | POA: Diagnosis not present

## 2016-08-16 DIAGNOSIS — J3089 Other allergic rhinitis: Secondary | ICD-10-CM | POA: Diagnosis not present

## 2016-08-16 DIAGNOSIS — J301 Allergic rhinitis due to pollen: Secondary | ICD-10-CM | POA: Diagnosis not present

## 2016-08-16 DIAGNOSIS — J3081 Allergic rhinitis due to animal (cat) (dog) hair and dander: Secondary | ICD-10-CM | POA: Diagnosis not present

## 2016-08-21 DIAGNOSIS — R509 Fever, unspecified: Secondary | ICD-10-CM | POA: Diagnosis not present

## 2016-09-06 DIAGNOSIS — J3081 Allergic rhinitis due to animal (cat) (dog) hair and dander: Secondary | ICD-10-CM | POA: Diagnosis not present

## 2016-09-06 DIAGNOSIS — J3089 Other allergic rhinitis: Secondary | ICD-10-CM | POA: Diagnosis not present

## 2016-09-06 DIAGNOSIS — J301 Allergic rhinitis due to pollen: Secondary | ICD-10-CM | POA: Diagnosis not present

## 2016-09-12 DIAGNOSIS — J3089 Other allergic rhinitis: Secondary | ICD-10-CM | POA: Diagnosis not present

## 2016-09-12 DIAGNOSIS — J3081 Allergic rhinitis due to animal (cat) (dog) hair and dander: Secondary | ICD-10-CM | POA: Diagnosis not present

## 2016-09-12 DIAGNOSIS — J301 Allergic rhinitis due to pollen: Secondary | ICD-10-CM | POA: Diagnosis not present

## 2016-09-24 DIAGNOSIS — J3081 Allergic rhinitis due to animal (cat) (dog) hair and dander: Secondary | ICD-10-CM | POA: Diagnosis not present

## 2016-09-24 DIAGNOSIS — J3089 Other allergic rhinitis: Secondary | ICD-10-CM | POA: Diagnosis not present

## 2016-09-24 DIAGNOSIS — J301 Allergic rhinitis due to pollen: Secondary | ICD-10-CM | POA: Diagnosis not present

## 2016-10-03 DIAGNOSIS — J3089 Other allergic rhinitis: Secondary | ICD-10-CM | POA: Diagnosis not present

## 2016-10-03 DIAGNOSIS — J301 Allergic rhinitis due to pollen: Secondary | ICD-10-CM | POA: Diagnosis not present

## 2016-10-03 DIAGNOSIS — J3081 Allergic rhinitis due to animal (cat) (dog) hair and dander: Secondary | ICD-10-CM | POA: Diagnosis not present

## 2016-10-21 DIAGNOSIS — J3089 Other allergic rhinitis: Secondary | ICD-10-CM | POA: Diagnosis not present

## 2016-10-21 DIAGNOSIS — J3081 Allergic rhinitis due to animal (cat) (dog) hair and dander: Secondary | ICD-10-CM | POA: Diagnosis not present

## 2016-10-21 DIAGNOSIS — J301 Allergic rhinitis due to pollen: Secondary | ICD-10-CM | POA: Diagnosis not present

## 2016-11-15 DIAGNOSIS — J3081 Allergic rhinitis due to animal (cat) (dog) hair and dander: Secondary | ICD-10-CM | POA: Diagnosis not present

## 2016-11-15 DIAGNOSIS — J3089 Other allergic rhinitis: Secondary | ICD-10-CM | POA: Diagnosis not present

## 2016-11-15 DIAGNOSIS — J301 Allergic rhinitis due to pollen: Secondary | ICD-10-CM | POA: Diagnosis not present

## 2016-11-18 DIAGNOSIS — J309 Allergic rhinitis, unspecified: Secondary | ICD-10-CM | POA: Diagnosis not present

## 2016-11-18 DIAGNOSIS — Z0000 Encounter for general adult medical examination without abnormal findings: Secondary | ICD-10-CM | POA: Diagnosis not present

## 2016-11-18 DIAGNOSIS — K219 Gastro-esophageal reflux disease without esophagitis: Secondary | ICD-10-CM | POA: Diagnosis not present

## 2016-11-22 DIAGNOSIS — J301 Allergic rhinitis due to pollen: Secondary | ICD-10-CM | POA: Diagnosis not present

## 2016-11-22 DIAGNOSIS — J3089 Other allergic rhinitis: Secondary | ICD-10-CM | POA: Diagnosis not present

## 2016-11-22 DIAGNOSIS — J3081 Allergic rhinitis due to animal (cat) (dog) hair and dander: Secondary | ICD-10-CM | POA: Diagnosis not present

## 2016-11-28 DIAGNOSIS — J301 Allergic rhinitis due to pollen: Secondary | ICD-10-CM | POA: Diagnosis not present

## 2016-11-28 DIAGNOSIS — J3089 Other allergic rhinitis: Secondary | ICD-10-CM | POA: Diagnosis not present

## 2016-11-28 DIAGNOSIS — J3081 Allergic rhinitis due to animal (cat) (dog) hair and dander: Secondary | ICD-10-CM | POA: Diagnosis not present

## 2016-12-12 DIAGNOSIS — J3089 Other allergic rhinitis: Secondary | ICD-10-CM | POA: Diagnosis not present

## 2016-12-12 DIAGNOSIS — J301 Allergic rhinitis due to pollen: Secondary | ICD-10-CM | POA: Diagnosis not present

## 2016-12-12 DIAGNOSIS — J3081 Allergic rhinitis due to animal (cat) (dog) hair and dander: Secondary | ICD-10-CM | POA: Diagnosis not present

## 2016-12-19 DIAGNOSIS — J3089 Other allergic rhinitis: Secondary | ICD-10-CM | POA: Diagnosis not present

## 2016-12-19 DIAGNOSIS — J301 Allergic rhinitis due to pollen: Secondary | ICD-10-CM | POA: Diagnosis not present

## 2016-12-19 DIAGNOSIS — J3081 Allergic rhinitis due to animal (cat) (dog) hair and dander: Secondary | ICD-10-CM | POA: Diagnosis not present

## 2016-12-24 DIAGNOSIS — J301 Allergic rhinitis due to pollen: Secondary | ICD-10-CM | POA: Diagnosis not present

## 2016-12-24 DIAGNOSIS — J3089 Other allergic rhinitis: Secondary | ICD-10-CM | POA: Diagnosis not present

## 2016-12-24 DIAGNOSIS — J3081 Allergic rhinitis due to animal (cat) (dog) hair and dander: Secondary | ICD-10-CM | POA: Diagnosis not present

## 2017-01-02 DIAGNOSIS — J3081 Allergic rhinitis due to animal (cat) (dog) hair and dander: Secondary | ICD-10-CM | POA: Diagnosis not present

## 2017-01-02 DIAGNOSIS — J301 Allergic rhinitis due to pollen: Secondary | ICD-10-CM | POA: Diagnosis not present

## 2017-01-02 DIAGNOSIS — J3089 Other allergic rhinitis: Secondary | ICD-10-CM | POA: Diagnosis not present

## 2017-01-06 DIAGNOSIS — E282 Polycystic ovarian syndrome: Secondary | ICD-10-CM | POA: Diagnosis not present

## 2017-01-06 DIAGNOSIS — Z1389 Encounter for screening for other disorder: Secondary | ICD-10-CM | POA: Diagnosis not present

## 2017-01-06 DIAGNOSIS — Z130 Encounter for screening for diseases of the blood and blood-forming organs and certain disorders involving the immune mechanism: Secondary | ICD-10-CM | POA: Diagnosis not present

## 2017-01-06 DIAGNOSIS — Z01419 Encounter for gynecological examination (general) (routine) without abnormal findings: Secondary | ICD-10-CM | POA: Diagnosis not present

## 2017-01-06 DIAGNOSIS — Z1231 Encounter for screening mammogram for malignant neoplasm of breast: Secondary | ICD-10-CM | POA: Diagnosis not present

## 2017-01-06 DIAGNOSIS — Z6824 Body mass index (BMI) 24.0-24.9, adult: Secondary | ICD-10-CM | POA: Diagnosis not present

## 2017-01-09 DIAGNOSIS — J3089 Other allergic rhinitis: Secondary | ICD-10-CM | POA: Diagnosis not present

## 2017-01-09 DIAGNOSIS — J3081 Allergic rhinitis due to animal (cat) (dog) hair and dander: Secondary | ICD-10-CM | POA: Diagnosis not present

## 2017-01-09 DIAGNOSIS — J301 Allergic rhinitis due to pollen: Secondary | ICD-10-CM | POA: Diagnosis not present

## 2017-01-21 DIAGNOSIS — J301 Allergic rhinitis due to pollen: Secondary | ICD-10-CM | POA: Diagnosis not present

## 2017-01-21 DIAGNOSIS — J3089 Other allergic rhinitis: Secondary | ICD-10-CM | POA: Diagnosis not present

## 2017-01-21 DIAGNOSIS — J3081 Allergic rhinitis due to animal (cat) (dog) hair and dander: Secondary | ICD-10-CM | POA: Diagnosis not present

## 2017-01-28 DIAGNOSIS — J3081 Allergic rhinitis due to animal (cat) (dog) hair and dander: Secondary | ICD-10-CM | POA: Diagnosis not present

## 2017-01-28 DIAGNOSIS — J301 Allergic rhinitis due to pollen: Secondary | ICD-10-CM | POA: Diagnosis not present

## 2017-01-28 DIAGNOSIS — J3089 Other allergic rhinitis: Secondary | ICD-10-CM | POA: Diagnosis not present

## 2017-02-06 DIAGNOSIS — J3089 Other allergic rhinitis: Secondary | ICD-10-CM | POA: Diagnosis not present

## 2017-02-06 DIAGNOSIS — J301 Allergic rhinitis due to pollen: Secondary | ICD-10-CM | POA: Diagnosis not present

## 2017-02-06 DIAGNOSIS — J3081 Allergic rhinitis due to animal (cat) (dog) hair and dander: Secondary | ICD-10-CM | POA: Diagnosis not present

## 2017-02-17 DIAGNOSIS — J3081 Allergic rhinitis due to animal (cat) (dog) hair and dander: Secondary | ICD-10-CM | POA: Diagnosis not present

## 2017-02-17 DIAGNOSIS — J3089 Other allergic rhinitis: Secondary | ICD-10-CM | POA: Diagnosis not present

## 2017-02-17 DIAGNOSIS — J301 Allergic rhinitis due to pollen: Secondary | ICD-10-CM | POA: Diagnosis not present

## 2017-03-07 DIAGNOSIS — J3089 Other allergic rhinitis: Secondary | ICD-10-CM | POA: Diagnosis not present

## 2017-03-07 DIAGNOSIS — J3081 Allergic rhinitis due to animal (cat) (dog) hair and dander: Secondary | ICD-10-CM | POA: Diagnosis not present

## 2017-03-07 DIAGNOSIS — J301 Allergic rhinitis due to pollen: Secondary | ICD-10-CM | POA: Diagnosis not present

## 2017-03-12 DIAGNOSIS — J3089 Other allergic rhinitis: Secondary | ICD-10-CM | POA: Diagnosis not present

## 2017-04-02 DIAGNOSIS — J3081 Allergic rhinitis due to animal (cat) (dog) hair and dander: Secondary | ICD-10-CM | POA: Diagnosis not present

## 2017-04-02 DIAGNOSIS — J3089 Other allergic rhinitis: Secondary | ICD-10-CM | POA: Diagnosis not present

## 2017-04-02 DIAGNOSIS — J301 Allergic rhinitis due to pollen: Secondary | ICD-10-CM | POA: Diagnosis not present

## 2017-04-10 DIAGNOSIS — J3089 Other allergic rhinitis: Secondary | ICD-10-CM | POA: Diagnosis not present

## 2017-04-10 DIAGNOSIS — J301 Allergic rhinitis due to pollen: Secondary | ICD-10-CM | POA: Diagnosis not present

## 2017-04-10 DIAGNOSIS — J3081 Allergic rhinitis due to animal (cat) (dog) hair and dander: Secondary | ICD-10-CM | POA: Diagnosis not present

## 2017-05-01 DIAGNOSIS — J301 Allergic rhinitis due to pollen: Secondary | ICD-10-CM | POA: Diagnosis not present

## 2017-05-01 DIAGNOSIS — J3089 Other allergic rhinitis: Secondary | ICD-10-CM | POA: Diagnosis not present

## 2017-05-01 DIAGNOSIS — J3081 Allergic rhinitis due to animal (cat) (dog) hair and dander: Secondary | ICD-10-CM | POA: Diagnosis not present

## 2017-05-08 DIAGNOSIS — J3089 Other allergic rhinitis: Secondary | ICD-10-CM | POA: Diagnosis not present

## 2017-05-08 DIAGNOSIS — J3081 Allergic rhinitis due to animal (cat) (dog) hair and dander: Secondary | ICD-10-CM | POA: Diagnosis not present

## 2017-05-08 DIAGNOSIS — J301 Allergic rhinitis due to pollen: Secondary | ICD-10-CM | POA: Diagnosis not present

## 2017-05-13 DIAGNOSIS — J3089 Other allergic rhinitis: Secondary | ICD-10-CM | POA: Diagnosis not present

## 2017-05-13 DIAGNOSIS — J3081 Allergic rhinitis due to animal (cat) (dog) hair and dander: Secondary | ICD-10-CM | POA: Diagnosis not present

## 2017-05-13 DIAGNOSIS — J301 Allergic rhinitis due to pollen: Secondary | ICD-10-CM | POA: Diagnosis not present

## 2017-05-15 DIAGNOSIS — J3089 Other allergic rhinitis: Secondary | ICD-10-CM | POA: Diagnosis not present

## 2017-05-22 DIAGNOSIS — J301 Allergic rhinitis due to pollen: Secondary | ICD-10-CM | POA: Diagnosis not present

## 2017-05-22 DIAGNOSIS — J3081 Allergic rhinitis due to animal (cat) (dog) hair and dander: Secondary | ICD-10-CM | POA: Diagnosis not present

## 2017-05-22 DIAGNOSIS — J3089 Other allergic rhinitis: Secondary | ICD-10-CM | POA: Diagnosis not present

## 2017-05-23 DIAGNOSIS — J3081 Allergic rhinitis due to animal (cat) (dog) hair and dander: Secondary | ICD-10-CM | POA: Diagnosis not present

## 2017-05-23 DIAGNOSIS — J301 Allergic rhinitis due to pollen: Secondary | ICD-10-CM | POA: Diagnosis not present

## 2017-05-23 DIAGNOSIS — R0602 Shortness of breath: Secondary | ICD-10-CM | POA: Diagnosis not present

## 2017-05-23 DIAGNOSIS — J3089 Other allergic rhinitis: Secondary | ICD-10-CM | POA: Diagnosis not present

## 2017-05-28 DIAGNOSIS — Z86018 Personal history of other benign neoplasm: Secondary | ICD-10-CM | POA: Diagnosis not present

## 2017-05-28 DIAGNOSIS — D1801 Hemangioma of skin and subcutaneous tissue: Secondary | ICD-10-CM | POA: Diagnosis not present

## 2017-05-28 DIAGNOSIS — D225 Melanocytic nevi of trunk: Secondary | ICD-10-CM | POA: Diagnosis not present

## 2017-05-28 DIAGNOSIS — L814 Other melanin hyperpigmentation: Secondary | ICD-10-CM | POA: Diagnosis not present

## 2017-06-03 DIAGNOSIS — J3081 Allergic rhinitis due to animal (cat) (dog) hair and dander: Secondary | ICD-10-CM | POA: Diagnosis not present

## 2017-06-03 DIAGNOSIS — J301 Allergic rhinitis due to pollen: Secondary | ICD-10-CM | POA: Diagnosis not present

## 2017-06-03 DIAGNOSIS — J3089 Other allergic rhinitis: Secondary | ICD-10-CM | POA: Diagnosis not present

## 2017-06-11 DIAGNOSIS — J3089 Other allergic rhinitis: Secondary | ICD-10-CM | POA: Diagnosis not present

## 2017-06-11 DIAGNOSIS — J3081 Allergic rhinitis due to animal (cat) (dog) hair and dander: Secondary | ICD-10-CM | POA: Diagnosis not present

## 2017-06-11 DIAGNOSIS — J301 Allergic rhinitis due to pollen: Secondary | ICD-10-CM | POA: Diagnosis not present

## 2017-06-12 DIAGNOSIS — J3089 Other allergic rhinitis: Secondary | ICD-10-CM | POA: Diagnosis not present

## 2017-06-12 DIAGNOSIS — J301 Allergic rhinitis due to pollen: Secondary | ICD-10-CM | POA: Diagnosis not present

## 2017-06-26 DIAGNOSIS — J3081 Allergic rhinitis due to animal (cat) (dog) hair and dander: Secondary | ICD-10-CM | POA: Diagnosis not present

## 2017-06-26 DIAGNOSIS — J301 Allergic rhinitis due to pollen: Secondary | ICD-10-CM | POA: Diagnosis not present

## 2017-06-26 DIAGNOSIS — J3089 Other allergic rhinitis: Secondary | ICD-10-CM | POA: Diagnosis not present

## 2017-07-08 DIAGNOSIS — J3081 Allergic rhinitis due to animal (cat) (dog) hair and dander: Secondary | ICD-10-CM | POA: Diagnosis not present

## 2017-07-08 DIAGNOSIS — J301 Allergic rhinitis due to pollen: Secondary | ICD-10-CM | POA: Diagnosis not present

## 2017-07-08 DIAGNOSIS — J3089 Other allergic rhinitis: Secondary | ICD-10-CM | POA: Diagnosis not present

## 2017-07-10 DIAGNOSIS — J3081 Allergic rhinitis due to animal (cat) (dog) hair and dander: Secondary | ICD-10-CM | POA: Diagnosis not present

## 2017-07-10 DIAGNOSIS — J301 Allergic rhinitis due to pollen: Secondary | ICD-10-CM | POA: Diagnosis not present

## 2017-07-15 DIAGNOSIS — J3081 Allergic rhinitis due to animal (cat) (dog) hair and dander: Secondary | ICD-10-CM | POA: Diagnosis not present

## 2017-07-15 DIAGNOSIS — J301 Allergic rhinitis due to pollen: Secondary | ICD-10-CM | POA: Diagnosis not present

## 2017-07-17 DIAGNOSIS — J3081 Allergic rhinitis due to animal (cat) (dog) hair and dander: Secondary | ICD-10-CM | POA: Diagnosis not present

## 2017-07-17 DIAGNOSIS — J301 Allergic rhinitis due to pollen: Secondary | ICD-10-CM | POA: Diagnosis not present

## 2017-07-23 DIAGNOSIS — J3089 Other allergic rhinitis: Secondary | ICD-10-CM | POA: Diagnosis not present

## 2017-07-23 DIAGNOSIS — J301 Allergic rhinitis due to pollen: Secondary | ICD-10-CM | POA: Diagnosis not present

## 2017-07-23 DIAGNOSIS — J3081 Allergic rhinitis due to animal (cat) (dog) hair and dander: Secondary | ICD-10-CM | POA: Diagnosis not present

## 2017-08-05 DIAGNOSIS — J301 Allergic rhinitis due to pollen: Secondary | ICD-10-CM | POA: Diagnosis not present

## 2017-08-05 DIAGNOSIS — J3081 Allergic rhinitis due to animal (cat) (dog) hair and dander: Secondary | ICD-10-CM | POA: Diagnosis not present

## 2017-08-05 DIAGNOSIS — J3089 Other allergic rhinitis: Secondary | ICD-10-CM | POA: Diagnosis not present

## 2017-08-21 DIAGNOSIS — J301 Allergic rhinitis due to pollen: Secondary | ICD-10-CM | POA: Diagnosis not present

## 2017-08-21 DIAGNOSIS — J3081 Allergic rhinitis due to animal (cat) (dog) hair and dander: Secondary | ICD-10-CM | POA: Diagnosis not present

## 2017-08-21 DIAGNOSIS — J3089 Other allergic rhinitis: Secondary | ICD-10-CM | POA: Diagnosis not present

## 2017-09-05 DIAGNOSIS — J301 Allergic rhinitis due to pollen: Secondary | ICD-10-CM | POA: Diagnosis not present

## 2017-09-05 DIAGNOSIS — J3081 Allergic rhinitis due to animal (cat) (dog) hair and dander: Secondary | ICD-10-CM | POA: Diagnosis not present

## 2017-09-05 DIAGNOSIS — J3089 Other allergic rhinitis: Secondary | ICD-10-CM | POA: Diagnosis not present

## 2017-09-18 DIAGNOSIS — J3081 Allergic rhinitis due to animal (cat) (dog) hair and dander: Secondary | ICD-10-CM | POA: Diagnosis not present

## 2017-09-18 DIAGNOSIS — J301 Allergic rhinitis due to pollen: Secondary | ICD-10-CM | POA: Diagnosis not present

## 2017-09-18 DIAGNOSIS — J3089 Other allergic rhinitis: Secondary | ICD-10-CM | POA: Diagnosis not present

## 2017-09-30 DIAGNOSIS — J301 Allergic rhinitis due to pollen: Secondary | ICD-10-CM | POA: Diagnosis not present

## 2017-09-30 DIAGNOSIS — J3089 Other allergic rhinitis: Secondary | ICD-10-CM | POA: Diagnosis not present

## 2017-09-30 DIAGNOSIS — J3081 Allergic rhinitis due to animal (cat) (dog) hair and dander: Secondary | ICD-10-CM | POA: Diagnosis not present

## 2017-10-16 DIAGNOSIS — J301 Allergic rhinitis due to pollen: Secondary | ICD-10-CM | POA: Diagnosis not present

## 2017-10-16 DIAGNOSIS — J3081 Allergic rhinitis due to animal (cat) (dog) hair and dander: Secondary | ICD-10-CM | POA: Diagnosis not present

## 2017-10-16 DIAGNOSIS — J3089 Other allergic rhinitis: Secondary | ICD-10-CM | POA: Diagnosis not present

## 2017-10-30 DIAGNOSIS — J3089 Other allergic rhinitis: Secondary | ICD-10-CM | POA: Diagnosis not present

## 2017-10-30 DIAGNOSIS — J301 Allergic rhinitis due to pollen: Secondary | ICD-10-CM | POA: Diagnosis not present

## 2017-10-30 DIAGNOSIS — J3081 Allergic rhinitis due to animal (cat) (dog) hair and dander: Secondary | ICD-10-CM | POA: Diagnosis not present

## 2017-11-11 DIAGNOSIS — J3089 Other allergic rhinitis: Secondary | ICD-10-CM | POA: Diagnosis not present

## 2017-11-11 DIAGNOSIS — J301 Allergic rhinitis due to pollen: Secondary | ICD-10-CM | POA: Diagnosis not present

## 2017-11-11 DIAGNOSIS — J3081 Allergic rhinitis due to animal (cat) (dog) hair and dander: Secondary | ICD-10-CM | POA: Diagnosis not present

## 2017-11-18 DIAGNOSIS — J3089 Other allergic rhinitis: Secondary | ICD-10-CM | POA: Diagnosis not present

## 2017-11-25 DIAGNOSIS — J3089 Other allergic rhinitis: Secondary | ICD-10-CM | POA: Diagnosis not present

## 2017-11-25 DIAGNOSIS — J301 Allergic rhinitis due to pollen: Secondary | ICD-10-CM | POA: Diagnosis not present

## 2017-12-11 DIAGNOSIS — J3081 Allergic rhinitis due to animal (cat) (dog) hair and dander: Secondary | ICD-10-CM | POA: Diagnosis not present

## 2017-12-11 DIAGNOSIS — J3089 Other allergic rhinitis: Secondary | ICD-10-CM | POA: Diagnosis not present

## 2017-12-11 DIAGNOSIS — J301 Allergic rhinitis due to pollen: Secondary | ICD-10-CM | POA: Diagnosis not present

## 2017-12-26 DIAGNOSIS — J3089 Other allergic rhinitis: Secondary | ICD-10-CM | POA: Diagnosis not present

## 2017-12-26 DIAGNOSIS — J3081 Allergic rhinitis due to animal (cat) (dog) hair and dander: Secondary | ICD-10-CM | POA: Diagnosis not present

## 2017-12-26 DIAGNOSIS — J301 Allergic rhinitis due to pollen: Secondary | ICD-10-CM | POA: Diagnosis not present

## 2018-01-06 DIAGNOSIS — J3089 Other allergic rhinitis: Secondary | ICD-10-CM | POA: Diagnosis not present

## 2018-01-06 DIAGNOSIS — J301 Allergic rhinitis due to pollen: Secondary | ICD-10-CM | POA: Diagnosis not present

## 2018-01-06 DIAGNOSIS — J3081 Allergic rhinitis due to animal (cat) (dog) hair and dander: Secondary | ICD-10-CM | POA: Diagnosis not present

## 2018-01-07 DIAGNOSIS — Z0000 Encounter for general adult medical examination without abnormal findings: Secondary | ICD-10-CM | POA: Diagnosis not present

## 2018-01-07 DIAGNOSIS — Z01419 Encounter for gynecological examination (general) (routine) without abnormal findings: Secondary | ICD-10-CM | POA: Diagnosis not present

## 2018-01-07 DIAGNOSIS — Z1389 Encounter for screening for other disorder: Secondary | ICD-10-CM | POA: Diagnosis not present

## 2018-01-07 DIAGNOSIS — Z1231 Encounter for screening mammogram for malignant neoplasm of breast: Secondary | ICD-10-CM | POA: Diagnosis not present

## 2018-01-07 DIAGNOSIS — Z6825 Body mass index (BMI) 25.0-25.9, adult: Secondary | ICD-10-CM | POA: Diagnosis not present

## 2018-01-07 DIAGNOSIS — Z124 Encounter for screening for malignant neoplasm of cervix: Secondary | ICD-10-CM | POA: Diagnosis not present

## 2018-01-20 DIAGNOSIS — J301 Allergic rhinitis due to pollen: Secondary | ICD-10-CM | POA: Diagnosis not present

## 2018-01-20 DIAGNOSIS — J3089 Other allergic rhinitis: Secondary | ICD-10-CM | POA: Diagnosis not present

## 2018-01-20 DIAGNOSIS — J3081 Allergic rhinitis due to animal (cat) (dog) hair and dander: Secondary | ICD-10-CM | POA: Diagnosis not present

## 2018-02-05 DIAGNOSIS — J301 Allergic rhinitis due to pollen: Secondary | ICD-10-CM | POA: Diagnosis not present

## 2018-02-05 DIAGNOSIS — J3089 Other allergic rhinitis: Secondary | ICD-10-CM | POA: Diagnosis not present

## 2018-02-05 DIAGNOSIS — J3081 Allergic rhinitis due to animal (cat) (dog) hair and dander: Secondary | ICD-10-CM | POA: Diagnosis not present

## 2018-02-19 DIAGNOSIS — J3089 Other allergic rhinitis: Secondary | ICD-10-CM | POA: Diagnosis not present

## 2018-02-19 DIAGNOSIS — J301 Allergic rhinitis due to pollen: Secondary | ICD-10-CM | POA: Diagnosis not present

## 2018-02-19 DIAGNOSIS — J3081 Allergic rhinitis due to animal (cat) (dog) hair and dander: Secondary | ICD-10-CM | POA: Diagnosis not present

## 2018-03-10 DIAGNOSIS — J301 Allergic rhinitis due to pollen: Secondary | ICD-10-CM | POA: Diagnosis not present

## 2018-03-10 DIAGNOSIS — J3089 Other allergic rhinitis: Secondary | ICD-10-CM | POA: Diagnosis not present

## 2018-03-10 DIAGNOSIS — J3081 Allergic rhinitis due to animal (cat) (dog) hair and dander: Secondary | ICD-10-CM | POA: Diagnosis not present

## 2018-03-25 DIAGNOSIS — J3081 Allergic rhinitis due to animal (cat) (dog) hair and dander: Secondary | ICD-10-CM | POA: Diagnosis not present

## 2018-03-25 DIAGNOSIS — J3089 Other allergic rhinitis: Secondary | ICD-10-CM | POA: Diagnosis not present

## 2018-03-25 DIAGNOSIS — J301 Allergic rhinitis due to pollen: Secondary | ICD-10-CM | POA: Diagnosis not present

## 2018-03-26 DIAGNOSIS — J3081 Allergic rhinitis due to animal (cat) (dog) hair and dander: Secondary | ICD-10-CM | POA: Diagnosis not present

## 2018-03-26 DIAGNOSIS — J301 Allergic rhinitis due to pollen: Secondary | ICD-10-CM | POA: Diagnosis not present

## 2018-04-03 DIAGNOSIS — J301 Allergic rhinitis due to pollen: Secondary | ICD-10-CM | POA: Diagnosis not present

## 2018-04-03 DIAGNOSIS — J3081 Allergic rhinitis due to animal (cat) (dog) hair and dander: Secondary | ICD-10-CM | POA: Diagnosis not present

## 2018-04-03 DIAGNOSIS — J3089 Other allergic rhinitis: Secondary | ICD-10-CM | POA: Diagnosis not present

## 2018-04-10 DIAGNOSIS — J3081 Allergic rhinitis due to animal (cat) (dog) hair and dander: Secondary | ICD-10-CM | POA: Diagnosis not present

## 2018-04-10 DIAGNOSIS — J3089 Other allergic rhinitis: Secondary | ICD-10-CM | POA: Diagnosis not present

## 2018-04-10 DIAGNOSIS — J301 Allergic rhinitis due to pollen: Secondary | ICD-10-CM | POA: Diagnosis not present

## 2018-04-20 DIAGNOSIS — J3089 Other allergic rhinitis: Secondary | ICD-10-CM | POA: Diagnosis not present

## 2018-04-20 DIAGNOSIS — J301 Allergic rhinitis due to pollen: Secondary | ICD-10-CM | POA: Diagnosis not present

## 2018-04-20 DIAGNOSIS — J3081 Allergic rhinitis due to animal (cat) (dog) hair and dander: Secondary | ICD-10-CM | POA: Diagnosis not present

## 2018-04-30 DIAGNOSIS — J3089 Other allergic rhinitis: Secondary | ICD-10-CM | POA: Diagnosis not present

## 2018-04-30 DIAGNOSIS — J301 Allergic rhinitis due to pollen: Secondary | ICD-10-CM | POA: Diagnosis not present

## 2018-04-30 DIAGNOSIS — J3081 Allergic rhinitis due to animal (cat) (dog) hair and dander: Secondary | ICD-10-CM | POA: Diagnosis not present

## 2018-05-07 DIAGNOSIS — J3081 Allergic rhinitis due to animal (cat) (dog) hair and dander: Secondary | ICD-10-CM | POA: Diagnosis not present

## 2018-05-07 DIAGNOSIS — J3089 Other allergic rhinitis: Secondary | ICD-10-CM | POA: Diagnosis not present

## 2018-05-07 DIAGNOSIS — J301 Allergic rhinitis due to pollen: Secondary | ICD-10-CM | POA: Diagnosis not present

## 2018-05-14 DIAGNOSIS — J301 Allergic rhinitis due to pollen: Secondary | ICD-10-CM | POA: Diagnosis not present

## 2018-05-14 DIAGNOSIS — J3081 Allergic rhinitis due to animal (cat) (dog) hair and dander: Secondary | ICD-10-CM | POA: Diagnosis not present

## 2018-05-25 DIAGNOSIS — D1801 Hemangioma of skin and subcutaneous tissue: Secondary | ICD-10-CM | POA: Diagnosis not present

## 2018-05-25 DIAGNOSIS — L814 Other melanin hyperpigmentation: Secondary | ICD-10-CM | POA: Diagnosis not present

## 2018-05-25 DIAGNOSIS — D485 Neoplasm of uncertain behavior of skin: Secondary | ICD-10-CM | POA: Diagnosis not present

## 2018-05-25 DIAGNOSIS — Z86018 Personal history of other benign neoplasm: Secondary | ICD-10-CM | POA: Diagnosis not present

## 2018-05-25 DIAGNOSIS — D225 Melanocytic nevi of trunk: Secondary | ICD-10-CM | POA: Diagnosis not present

## 2018-05-26 DIAGNOSIS — J3089 Other allergic rhinitis: Secondary | ICD-10-CM | POA: Diagnosis not present

## 2018-05-26 DIAGNOSIS — J3081 Allergic rhinitis due to animal (cat) (dog) hair and dander: Secondary | ICD-10-CM | POA: Diagnosis not present

## 2018-05-26 DIAGNOSIS — J301 Allergic rhinitis due to pollen: Secondary | ICD-10-CM | POA: Diagnosis not present

## 2018-05-27 DIAGNOSIS — J301 Allergic rhinitis due to pollen: Secondary | ICD-10-CM | POA: Diagnosis not present

## 2018-05-27 DIAGNOSIS — J3089 Other allergic rhinitis: Secondary | ICD-10-CM | POA: Diagnosis not present

## 2018-05-27 DIAGNOSIS — R0602 Shortness of breath: Secondary | ICD-10-CM | POA: Diagnosis not present

## 2018-05-27 DIAGNOSIS — J3081 Allergic rhinitis due to animal (cat) (dog) hair and dander: Secondary | ICD-10-CM | POA: Diagnosis not present

## 2018-06-05 DIAGNOSIS — J301 Allergic rhinitis due to pollen: Secondary | ICD-10-CM | POA: Diagnosis not present

## 2018-06-05 DIAGNOSIS — J3081 Allergic rhinitis due to animal (cat) (dog) hair and dander: Secondary | ICD-10-CM | POA: Diagnosis not present

## 2018-06-05 DIAGNOSIS — J3089 Other allergic rhinitis: Secondary | ICD-10-CM | POA: Diagnosis not present

## 2018-06-11 DIAGNOSIS — J3081 Allergic rhinitis due to animal (cat) (dog) hair and dander: Secondary | ICD-10-CM | POA: Diagnosis not present

## 2018-06-11 DIAGNOSIS — J301 Allergic rhinitis due to pollen: Secondary | ICD-10-CM | POA: Diagnosis not present

## 2018-06-11 DIAGNOSIS — J3089 Other allergic rhinitis: Secondary | ICD-10-CM | POA: Diagnosis not present

## 2018-06-19 DIAGNOSIS — J3081 Allergic rhinitis due to animal (cat) (dog) hair and dander: Secondary | ICD-10-CM | POA: Diagnosis not present

## 2018-06-19 DIAGNOSIS — J3089 Other allergic rhinitis: Secondary | ICD-10-CM | POA: Diagnosis not present

## 2018-06-19 DIAGNOSIS — J301 Allergic rhinitis due to pollen: Secondary | ICD-10-CM | POA: Diagnosis not present

## 2018-06-25 DIAGNOSIS — J301 Allergic rhinitis due to pollen: Secondary | ICD-10-CM | POA: Diagnosis not present

## 2018-06-25 DIAGNOSIS — J3089 Other allergic rhinitis: Secondary | ICD-10-CM | POA: Diagnosis not present

## 2018-06-25 DIAGNOSIS — J3081 Allergic rhinitis due to animal (cat) (dog) hair and dander: Secondary | ICD-10-CM | POA: Diagnosis not present

## 2018-07-07 DIAGNOSIS — J3089 Other allergic rhinitis: Secondary | ICD-10-CM | POA: Diagnosis not present

## 2018-07-07 DIAGNOSIS — J301 Allergic rhinitis due to pollen: Secondary | ICD-10-CM | POA: Diagnosis not present

## 2018-07-07 DIAGNOSIS — J3081 Allergic rhinitis due to animal (cat) (dog) hair and dander: Secondary | ICD-10-CM | POA: Diagnosis not present

## 2018-07-21 DIAGNOSIS — J301 Allergic rhinitis due to pollen: Secondary | ICD-10-CM | POA: Diagnosis not present

## 2018-07-21 DIAGNOSIS — J3081 Allergic rhinitis due to animal (cat) (dog) hair and dander: Secondary | ICD-10-CM | POA: Diagnosis not present

## 2018-07-21 DIAGNOSIS — J3089 Other allergic rhinitis: Secondary | ICD-10-CM | POA: Diagnosis not present

## 2018-08-04 DIAGNOSIS — J3089 Other allergic rhinitis: Secondary | ICD-10-CM | POA: Diagnosis not present

## 2018-08-04 DIAGNOSIS — J301 Allergic rhinitis due to pollen: Secondary | ICD-10-CM | POA: Diagnosis not present

## 2018-08-04 DIAGNOSIS — J3081 Allergic rhinitis due to animal (cat) (dog) hair and dander: Secondary | ICD-10-CM | POA: Diagnosis not present

## 2018-08-06 DIAGNOSIS — D239 Other benign neoplasm of skin, unspecified: Secondary | ICD-10-CM | POA: Diagnosis not present

## 2018-08-06 DIAGNOSIS — D485 Neoplasm of uncertain behavior of skin: Secondary | ICD-10-CM | POA: Diagnosis not present

## 2018-08-18 DIAGNOSIS — J301 Allergic rhinitis due to pollen: Secondary | ICD-10-CM | POA: Diagnosis not present

## 2018-08-18 DIAGNOSIS — J3089 Other allergic rhinitis: Secondary | ICD-10-CM | POA: Diagnosis not present

## 2018-08-18 DIAGNOSIS — J3081 Allergic rhinitis due to animal (cat) (dog) hair and dander: Secondary | ICD-10-CM | POA: Diagnosis not present

## 2018-09-04 DIAGNOSIS — J3081 Allergic rhinitis due to animal (cat) (dog) hair and dander: Secondary | ICD-10-CM | POA: Diagnosis not present

## 2018-09-04 DIAGNOSIS — J301 Allergic rhinitis due to pollen: Secondary | ICD-10-CM | POA: Diagnosis not present

## 2018-09-04 DIAGNOSIS — J3089 Other allergic rhinitis: Secondary | ICD-10-CM | POA: Diagnosis not present

## 2018-09-08 DIAGNOSIS — J3089 Other allergic rhinitis: Secondary | ICD-10-CM | POA: Diagnosis not present

## 2018-09-15 DIAGNOSIS — J3089 Other allergic rhinitis: Secondary | ICD-10-CM | POA: Diagnosis not present

## 2018-09-15 DIAGNOSIS — J301 Allergic rhinitis due to pollen: Secondary | ICD-10-CM | POA: Diagnosis not present

## 2018-09-15 DIAGNOSIS — J3081 Allergic rhinitis due to animal (cat) (dog) hair and dander: Secondary | ICD-10-CM | POA: Diagnosis not present

## 2018-09-29 DIAGNOSIS — J3089 Other allergic rhinitis: Secondary | ICD-10-CM | POA: Diagnosis not present

## 2018-09-29 DIAGNOSIS — J301 Allergic rhinitis due to pollen: Secondary | ICD-10-CM | POA: Diagnosis not present

## 2018-09-29 DIAGNOSIS — J3081 Allergic rhinitis due to animal (cat) (dog) hair and dander: Secondary | ICD-10-CM | POA: Diagnosis not present

## 2018-10-15 DIAGNOSIS — J3089 Other allergic rhinitis: Secondary | ICD-10-CM | POA: Diagnosis not present

## 2018-10-15 DIAGNOSIS — J301 Allergic rhinitis due to pollen: Secondary | ICD-10-CM | POA: Diagnosis not present

## 2018-10-15 DIAGNOSIS — J3081 Allergic rhinitis due to animal (cat) (dog) hair and dander: Secondary | ICD-10-CM | POA: Diagnosis not present

## 2018-10-27 DIAGNOSIS — J301 Allergic rhinitis due to pollen: Secondary | ICD-10-CM | POA: Diagnosis not present

## 2018-10-27 DIAGNOSIS — J3081 Allergic rhinitis due to animal (cat) (dog) hair and dander: Secondary | ICD-10-CM | POA: Diagnosis not present

## 2018-10-27 DIAGNOSIS — J3089 Other allergic rhinitis: Secondary | ICD-10-CM | POA: Diagnosis not present

## 2018-11-12 DIAGNOSIS — J3089 Other allergic rhinitis: Secondary | ICD-10-CM | POA: Diagnosis not present

## 2018-11-12 DIAGNOSIS — J3081 Allergic rhinitis due to animal (cat) (dog) hair and dander: Secondary | ICD-10-CM | POA: Diagnosis not present

## 2018-11-12 DIAGNOSIS — J301 Allergic rhinitis due to pollen: Secondary | ICD-10-CM | POA: Diagnosis not present

## 2019-02-11 DIAGNOSIS — Z01419 Encounter for gynecological examination (general) (routine) without abnormal findings: Secondary | ICD-10-CM | POA: Diagnosis not present

## 2019-02-11 DIAGNOSIS — Z1231 Encounter for screening mammogram for malignant neoplasm of breast: Secondary | ICD-10-CM | POA: Diagnosis not present

## 2019-02-11 DIAGNOSIS — Z6825 Body mass index (BMI) 25.0-25.9, adult: Secondary | ICD-10-CM | POA: Diagnosis not present

## 2019-02-11 DIAGNOSIS — Z130 Encounter for screening for diseases of the blood and blood-forming organs and certain disorders involving the immune mechanism: Secondary | ICD-10-CM | POA: Diagnosis not present

## 2019-02-11 DIAGNOSIS — Z1389 Encounter for screening for other disorder: Secondary | ICD-10-CM | POA: Diagnosis not present

## 2019-04-13 DIAGNOSIS — J3081 Allergic rhinitis due to animal (cat) (dog) hair and dander: Secondary | ICD-10-CM | POA: Diagnosis not present

## 2019-04-13 DIAGNOSIS — J301 Allergic rhinitis due to pollen: Secondary | ICD-10-CM | POA: Diagnosis not present

## 2019-04-14 DIAGNOSIS — J3089 Other allergic rhinitis: Secondary | ICD-10-CM | POA: Diagnosis not present

## 2019-05-12 DIAGNOSIS — J3089 Other allergic rhinitis: Secondary | ICD-10-CM | POA: Diagnosis not present

## 2019-05-12 DIAGNOSIS — R0602 Shortness of breath: Secondary | ICD-10-CM | POA: Diagnosis not present

## 2019-05-12 DIAGNOSIS — J301 Allergic rhinitis due to pollen: Secondary | ICD-10-CM | POA: Diagnosis not present

## 2019-05-12 DIAGNOSIS — J3081 Allergic rhinitis due to animal (cat) (dog) hair and dander: Secondary | ICD-10-CM | POA: Diagnosis not present

## 2019-05-18 DIAGNOSIS — J3081 Allergic rhinitis due to animal (cat) (dog) hair and dander: Secondary | ICD-10-CM | POA: Diagnosis not present

## 2019-05-18 DIAGNOSIS — J3089 Other allergic rhinitis: Secondary | ICD-10-CM | POA: Diagnosis not present

## 2019-05-18 DIAGNOSIS — J301 Allergic rhinitis due to pollen: Secondary | ICD-10-CM | POA: Diagnosis not present

## 2019-05-25 DIAGNOSIS — J3089 Other allergic rhinitis: Secondary | ICD-10-CM | POA: Diagnosis not present

## 2019-05-25 DIAGNOSIS — J3081 Allergic rhinitis due to animal (cat) (dog) hair and dander: Secondary | ICD-10-CM | POA: Diagnosis not present

## 2019-05-25 DIAGNOSIS — J301 Allergic rhinitis due to pollen: Secondary | ICD-10-CM | POA: Diagnosis not present

## 2019-05-31 DIAGNOSIS — L814 Other melanin hyperpigmentation: Secondary | ICD-10-CM | POA: Diagnosis not present

## 2019-05-31 DIAGNOSIS — Z23 Encounter for immunization: Secondary | ICD-10-CM | POA: Diagnosis not present

## 2019-05-31 DIAGNOSIS — Z86018 Personal history of other benign neoplasm: Secondary | ICD-10-CM | POA: Diagnosis not present

## 2019-05-31 DIAGNOSIS — D225 Melanocytic nevi of trunk: Secondary | ICD-10-CM | POA: Diagnosis not present

## 2019-05-31 DIAGNOSIS — D485 Neoplasm of uncertain behavior of skin: Secondary | ICD-10-CM | POA: Diagnosis not present

## 2019-06-04 DIAGNOSIS — J3081 Allergic rhinitis due to animal (cat) (dog) hair and dander: Secondary | ICD-10-CM | POA: Diagnosis not present

## 2019-06-04 DIAGNOSIS — J301 Allergic rhinitis due to pollen: Secondary | ICD-10-CM | POA: Diagnosis not present

## 2019-06-04 DIAGNOSIS — J3089 Other allergic rhinitis: Secondary | ICD-10-CM | POA: Diagnosis not present

## 2019-06-11 DIAGNOSIS — J3081 Allergic rhinitis due to animal (cat) (dog) hair and dander: Secondary | ICD-10-CM | POA: Diagnosis not present

## 2019-06-11 DIAGNOSIS — J301 Allergic rhinitis due to pollen: Secondary | ICD-10-CM | POA: Diagnosis not present

## 2019-06-11 DIAGNOSIS — J3089 Other allergic rhinitis: Secondary | ICD-10-CM | POA: Diagnosis not present

## 2019-06-18 DIAGNOSIS — J3081 Allergic rhinitis due to animal (cat) (dog) hair and dander: Secondary | ICD-10-CM | POA: Diagnosis not present

## 2019-06-18 DIAGNOSIS — J3089 Other allergic rhinitis: Secondary | ICD-10-CM | POA: Diagnosis not present

## 2019-06-18 DIAGNOSIS — J301 Allergic rhinitis due to pollen: Secondary | ICD-10-CM | POA: Diagnosis not present

## 2019-06-28 DIAGNOSIS — J3089 Other allergic rhinitis: Secondary | ICD-10-CM | POA: Diagnosis not present

## 2019-06-28 DIAGNOSIS — J301 Allergic rhinitis due to pollen: Secondary | ICD-10-CM | POA: Diagnosis not present

## 2019-06-28 DIAGNOSIS — J3081 Allergic rhinitis due to animal (cat) (dog) hair and dander: Secondary | ICD-10-CM | POA: Diagnosis not present

## 2019-07-06 DIAGNOSIS — J3081 Allergic rhinitis due to animal (cat) (dog) hair and dander: Secondary | ICD-10-CM | POA: Diagnosis not present

## 2019-07-06 DIAGNOSIS — J3089 Other allergic rhinitis: Secondary | ICD-10-CM | POA: Diagnosis not present

## 2019-07-06 DIAGNOSIS — J301 Allergic rhinitis due to pollen: Secondary | ICD-10-CM | POA: Diagnosis not present

## 2019-07-12 DIAGNOSIS — J3081 Allergic rhinitis due to animal (cat) (dog) hair and dander: Secondary | ICD-10-CM | POA: Diagnosis not present

## 2019-07-12 DIAGNOSIS — J301 Allergic rhinitis due to pollen: Secondary | ICD-10-CM | POA: Diagnosis not present

## 2019-07-12 DIAGNOSIS — J3089 Other allergic rhinitis: Secondary | ICD-10-CM | POA: Diagnosis not present

## 2019-07-22 DIAGNOSIS — J3089 Other allergic rhinitis: Secondary | ICD-10-CM | POA: Diagnosis not present

## 2019-07-22 DIAGNOSIS — J301 Allergic rhinitis due to pollen: Secondary | ICD-10-CM | POA: Diagnosis not present

## 2019-07-22 DIAGNOSIS — J3081 Allergic rhinitis due to animal (cat) (dog) hair and dander: Secondary | ICD-10-CM | POA: Diagnosis not present

## 2019-07-27 DIAGNOSIS — J301 Allergic rhinitis due to pollen: Secondary | ICD-10-CM | POA: Diagnosis not present

## 2019-07-27 DIAGNOSIS — J3089 Other allergic rhinitis: Secondary | ICD-10-CM | POA: Diagnosis not present

## 2019-07-27 DIAGNOSIS — J3081 Allergic rhinitis due to animal (cat) (dog) hair and dander: Secondary | ICD-10-CM | POA: Diagnosis not present

## 2019-08-03 DIAGNOSIS — J3089 Other allergic rhinitis: Secondary | ICD-10-CM | POA: Diagnosis not present

## 2019-08-03 DIAGNOSIS — J301 Allergic rhinitis due to pollen: Secondary | ICD-10-CM | POA: Diagnosis not present

## 2019-08-03 DIAGNOSIS — J3081 Allergic rhinitis due to animal (cat) (dog) hair and dander: Secondary | ICD-10-CM | POA: Diagnosis not present

## 2019-08-10 DIAGNOSIS — J3081 Allergic rhinitis due to animal (cat) (dog) hair and dander: Secondary | ICD-10-CM | POA: Diagnosis not present

## 2019-08-10 DIAGNOSIS — J301 Allergic rhinitis due to pollen: Secondary | ICD-10-CM | POA: Diagnosis not present

## 2019-08-10 DIAGNOSIS — J3089 Other allergic rhinitis: Secondary | ICD-10-CM | POA: Diagnosis not present

## 2019-08-13 DIAGNOSIS — J3081 Allergic rhinitis due to animal (cat) (dog) hair and dander: Secondary | ICD-10-CM | POA: Diagnosis not present

## 2019-08-13 DIAGNOSIS — J301 Allergic rhinitis due to pollen: Secondary | ICD-10-CM | POA: Diagnosis not present

## 2019-08-13 DIAGNOSIS — J3089 Other allergic rhinitis: Secondary | ICD-10-CM | POA: Diagnosis not present

## 2019-08-19 DIAGNOSIS — J3081 Allergic rhinitis due to animal (cat) (dog) hair and dander: Secondary | ICD-10-CM | POA: Diagnosis not present

## 2019-08-19 DIAGNOSIS — J301 Allergic rhinitis due to pollen: Secondary | ICD-10-CM | POA: Diagnosis not present

## 2019-08-19 DIAGNOSIS — J3089 Other allergic rhinitis: Secondary | ICD-10-CM | POA: Diagnosis not present

## 2019-08-24 DIAGNOSIS — J3081 Allergic rhinitis due to animal (cat) (dog) hair and dander: Secondary | ICD-10-CM | POA: Diagnosis not present

## 2019-08-24 DIAGNOSIS — J301 Allergic rhinitis due to pollen: Secondary | ICD-10-CM | POA: Diagnosis not present

## 2019-08-24 DIAGNOSIS — J3089 Other allergic rhinitis: Secondary | ICD-10-CM | POA: Diagnosis not present

## 2020-02-01 DIAGNOSIS — J3081 Allergic rhinitis due to animal (cat) (dog) hair and dander: Secondary | ICD-10-CM | POA: Diagnosis not present

## 2020-02-01 DIAGNOSIS — J301 Allergic rhinitis due to pollen: Secondary | ICD-10-CM | POA: Diagnosis not present

## 2020-02-04 DIAGNOSIS — J301 Allergic rhinitis due to pollen: Secondary | ICD-10-CM | POA: Diagnosis not present

## 2020-02-04 DIAGNOSIS — J3081 Allergic rhinitis due to animal (cat) (dog) hair and dander: Secondary | ICD-10-CM | POA: Diagnosis not present

## 2020-02-04 DIAGNOSIS — J3089 Other allergic rhinitis: Secondary | ICD-10-CM | POA: Diagnosis not present

## 2020-02-08 DIAGNOSIS — J301 Allergic rhinitis due to pollen: Secondary | ICD-10-CM | POA: Diagnosis not present

## 2020-02-08 DIAGNOSIS — J3081 Allergic rhinitis due to animal (cat) (dog) hair and dander: Secondary | ICD-10-CM | POA: Diagnosis not present

## 2020-02-08 DIAGNOSIS — J3089 Other allergic rhinitis: Secondary | ICD-10-CM | POA: Diagnosis not present

## 2020-02-11 DIAGNOSIS — J301 Allergic rhinitis due to pollen: Secondary | ICD-10-CM | POA: Diagnosis not present

## 2020-02-11 DIAGNOSIS — J3089 Other allergic rhinitis: Secondary | ICD-10-CM | POA: Diagnosis not present

## 2020-02-11 DIAGNOSIS — J3081 Allergic rhinitis due to animal (cat) (dog) hair and dander: Secondary | ICD-10-CM | POA: Diagnosis not present

## 2020-02-17 DIAGNOSIS — J3089 Other allergic rhinitis: Secondary | ICD-10-CM | POA: Diagnosis not present

## 2020-02-17 DIAGNOSIS — J301 Allergic rhinitis due to pollen: Secondary | ICD-10-CM | POA: Diagnosis not present

## 2020-02-17 DIAGNOSIS — J3081 Allergic rhinitis due to animal (cat) (dog) hair and dander: Secondary | ICD-10-CM | POA: Diagnosis not present

## 2020-02-29 DIAGNOSIS — J301 Allergic rhinitis due to pollen: Secondary | ICD-10-CM | POA: Diagnosis not present

## 2020-02-29 DIAGNOSIS — J3089 Other allergic rhinitis: Secondary | ICD-10-CM | POA: Diagnosis not present

## 2020-02-29 DIAGNOSIS — J3081 Allergic rhinitis due to animal (cat) (dog) hair and dander: Secondary | ICD-10-CM | POA: Diagnosis not present

## 2020-03-01 DIAGNOSIS — Z0000 Encounter for general adult medical examination without abnormal findings: Secondary | ICD-10-CM | POA: Diagnosis not present

## 2020-03-01 DIAGNOSIS — Z130 Encounter for screening for diseases of the blood and blood-forming organs and certain disorders involving the immune mechanism: Secondary | ICD-10-CM | POA: Diagnosis not present

## 2020-03-01 DIAGNOSIS — Z01419 Encounter for gynecological examination (general) (routine) without abnormal findings: Secondary | ICD-10-CM | POA: Diagnosis not present

## 2020-03-01 DIAGNOSIS — Z1231 Encounter for screening mammogram for malignant neoplasm of breast: Secondary | ICD-10-CM | POA: Diagnosis not present

## 2020-03-01 DIAGNOSIS — Z1389 Encounter for screening for other disorder: Secondary | ICD-10-CM | POA: Diagnosis not present

## 2020-03-01 DIAGNOSIS — Z6826 Body mass index (BMI) 26.0-26.9, adult: Secondary | ICD-10-CM | POA: Diagnosis not present

## 2020-03-14 DIAGNOSIS — J3089 Other allergic rhinitis: Secondary | ICD-10-CM | POA: Diagnosis not present

## 2020-03-14 DIAGNOSIS — J301 Allergic rhinitis due to pollen: Secondary | ICD-10-CM | POA: Diagnosis not present

## 2020-03-14 DIAGNOSIS — J3081 Allergic rhinitis due to animal (cat) (dog) hair and dander: Secondary | ICD-10-CM | POA: Diagnosis not present

## 2020-03-29 DIAGNOSIS — J3089 Other allergic rhinitis: Secondary | ICD-10-CM | POA: Diagnosis not present

## 2020-03-29 DIAGNOSIS — J301 Allergic rhinitis due to pollen: Secondary | ICD-10-CM | POA: Diagnosis not present

## 2020-03-29 DIAGNOSIS — J3081 Allergic rhinitis due to animal (cat) (dog) hair and dander: Secondary | ICD-10-CM | POA: Diagnosis not present

## 2020-04-19 DIAGNOSIS — J3089 Other allergic rhinitis: Secondary | ICD-10-CM | POA: Diagnosis not present

## 2020-04-19 DIAGNOSIS — J3081 Allergic rhinitis due to animal (cat) (dog) hair and dander: Secondary | ICD-10-CM | POA: Diagnosis not present

## 2020-04-19 DIAGNOSIS — J301 Allergic rhinitis due to pollen: Secondary | ICD-10-CM | POA: Diagnosis not present

## 2020-04-20 DIAGNOSIS — J3089 Other allergic rhinitis: Secondary | ICD-10-CM | POA: Diagnosis not present

## 2020-04-25 DIAGNOSIS — J3089 Other allergic rhinitis: Secondary | ICD-10-CM | POA: Diagnosis not present

## 2020-04-25 DIAGNOSIS — J301 Allergic rhinitis due to pollen: Secondary | ICD-10-CM | POA: Diagnosis not present

## 2020-04-25 DIAGNOSIS — J3081 Allergic rhinitis due to animal (cat) (dog) hair and dander: Secondary | ICD-10-CM | POA: Diagnosis not present

## 2020-04-28 DIAGNOSIS — J301 Allergic rhinitis due to pollen: Secondary | ICD-10-CM | POA: Diagnosis not present

## 2020-04-28 DIAGNOSIS — J3089 Other allergic rhinitis: Secondary | ICD-10-CM | POA: Diagnosis not present

## 2020-04-28 DIAGNOSIS — J3081 Allergic rhinitis due to animal (cat) (dog) hair and dander: Secondary | ICD-10-CM | POA: Diagnosis not present

## 2020-05-03 DIAGNOSIS — J3089 Other allergic rhinitis: Secondary | ICD-10-CM | POA: Diagnosis not present

## 2020-05-03 DIAGNOSIS — J301 Allergic rhinitis due to pollen: Secondary | ICD-10-CM | POA: Diagnosis not present

## 2020-05-03 DIAGNOSIS — J3081 Allergic rhinitis due to animal (cat) (dog) hair and dander: Secondary | ICD-10-CM | POA: Diagnosis not present

## 2020-05-05 DIAGNOSIS — J3081 Allergic rhinitis due to animal (cat) (dog) hair and dander: Secondary | ICD-10-CM | POA: Diagnosis not present

## 2020-05-05 DIAGNOSIS — J301 Allergic rhinitis due to pollen: Secondary | ICD-10-CM | POA: Diagnosis not present

## 2020-05-05 DIAGNOSIS — J3089 Other allergic rhinitis: Secondary | ICD-10-CM | POA: Diagnosis not present

## 2020-05-09 DIAGNOSIS — J3089 Other allergic rhinitis: Secondary | ICD-10-CM | POA: Diagnosis not present

## 2020-05-09 DIAGNOSIS — J301 Allergic rhinitis due to pollen: Secondary | ICD-10-CM | POA: Diagnosis not present

## 2020-05-09 DIAGNOSIS — J3081 Allergic rhinitis due to animal (cat) (dog) hair and dander: Secondary | ICD-10-CM | POA: Diagnosis not present

## 2020-05-23 DIAGNOSIS — J3089 Other allergic rhinitis: Secondary | ICD-10-CM | POA: Diagnosis not present

## 2020-05-23 DIAGNOSIS — J301 Allergic rhinitis due to pollen: Secondary | ICD-10-CM | POA: Diagnosis not present

## 2020-05-23 DIAGNOSIS — J3081 Allergic rhinitis due to animal (cat) (dog) hair and dander: Secondary | ICD-10-CM | POA: Diagnosis not present

## 2020-05-29 DIAGNOSIS — J301 Allergic rhinitis due to pollen: Secondary | ICD-10-CM | POA: Diagnosis not present

## 2020-05-29 DIAGNOSIS — R0602 Shortness of breath: Secondary | ICD-10-CM | POA: Diagnosis not present

## 2020-05-29 DIAGNOSIS — J3081 Allergic rhinitis due to animal (cat) (dog) hair and dander: Secondary | ICD-10-CM | POA: Diagnosis not present

## 2020-05-29 DIAGNOSIS — J3089 Other allergic rhinitis: Secondary | ICD-10-CM | POA: Diagnosis not present

## 2020-05-31 DIAGNOSIS — D2262 Melanocytic nevi of left upper limb, including shoulder: Secondary | ICD-10-CM | POA: Diagnosis not present

## 2020-05-31 DIAGNOSIS — D225 Melanocytic nevi of trunk: Secondary | ICD-10-CM | POA: Diagnosis not present

## 2020-05-31 DIAGNOSIS — D485 Neoplasm of uncertain behavior of skin: Secondary | ICD-10-CM | POA: Diagnosis not present

## 2020-05-31 DIAGNOSIS — L814 Other melanin hyperpigmentation: Secondary | ICD-10-CM | POA: Diagnosis not present

## 2020-05-31 DIAGNOSIS — L821 Other seborrheic keratosis: Secondary | ICD-10-CM | POA: Diagnosis not present

## 2020-05-31 DIAGNOSIS — Z86018 Personal history of other benign neoplasm: Secondary | ICD-10-CM | POA: Diagnosis not present

## 2020-06-07 DIAGNOSIS — Z20822 Contact with and (suspected) exposure to covid-19: Secondary | ICD-10-CM | POA: Diagnosis not present

## 2020-06-13 DIAGNOSIS — J3081 Allergic rhinitis due to animal (cat) (dog) hair and dander: Secondary | ICD-10-CM | POA: Diagnosis not present

## 2020-06-13 DIAGNOSIS — J3089 Other allergic rhinitis: Secondary | ICD-10-CM | POA: Diagnosis not present

## 2020-06-13 DIAGNOSIS — J301 Allergic rhinitis due to pollen: Secondary | ICD-10-CM | POA: Diagnosis not present

## 2020-06-29 DIAGNOSIS — J3081 Allergic rhinitis due to animal (cat) (dog) hair and dander: Secondary | ICD-10-CM | POA: Diagnosis not present

## 2020-06-29 DIAGNOSIS — J301 Allergic rhinitis due to pollen: Secondary | ICD-10-CM | POA: Diagnosis not present

## 2020-06-29 DIAGNOSIS — J3089 Other allergic rhinitis: Secondary | ICD-10-CM | POA: Diagnosis not present

## 2020-07-11 DIAGNOSIS — J3081 Allergic rhinitis due to animal (cat) (dog) hair and dander: Secondary | ICD-10-CM | POA: Diagnosis not present

## 2020-07-11 DIAGNOSIS — J301 Allergic rhinitis due to pollen: Secondary | ICD-10-CM | POA: Diagnosis not present

## 2020-07-11 DIAGNOSIS — J3089 Other allergic rhinitis: Secondary | ICD-10-CM | POA: Diagnosis not present

## 2020-07-26 DIAGNOSIS — J301 Allergic rhinitis due to pollen: Secondary | ICD-10-CM | POA: Diagnosis not present

## 2020-07-26 DIAGNOSIS — J3081 Allergic rhinitis due to animal (cat) (dog) hair and dander: Secondary | ICD-10-CM | POA: Diagnosis not present

## 2020-07-26 DIAGNOSIS — J3089 Other allergic rhinitis: Secondary | ICD-10-CM | POA: Diagnosis not present

## 2020-08-08 DIAGNOSIS — J3081 Allergic rhinitis due to animal (cat) (dog) hair and dander: Secondary | ICD-10-CM | POA: Diagnosis not present

## 2020-08-08 DIAGNOSIS — J301 Allergic rhinitis due to pollen: Secondary | ICD-10-CM | POA: Diagnosis not present

## 2020-08-08 DIAGNOSIS — J3089 Other allergic rhinitis: Secondary | ICD-10-CM | POA: Diagnosis not present

## 2020-08-14 DIAGNOSIS — J3089 Other allergic rhinitis: Secondary | ICD-10-CM | POA: Diagnosis not present

## 2020-08-21 DIAGNOSIS — J3089 Other allergic rhinitis: Secondary | ICD-10-CM | POA: Diagnosis not present

## 2020-08-21 DIAGNOSIS — J3081 Allergic rhinitis due to animal (cat) (dog) hair and dander: Secondary | ICD-10-CM | POA: Diagnosis not present

## 2020-08-21 DIAGNOSIS — J301 Allergic rhinitis due to pollen: Secondary | ICD-10-CM | POA: Diagnosis not present

## 2020-09-05 DIAGNOSIS — N61 Mastitis without abscess: Secondary | ICD-10-CM | POA: Diagnosis not present

## 2020-09-05 DIAGNOSIS — J301 Allergic rhinitis due to pollen: Secondary | ICD-10-CM | POA: Diagnosis not present

## 2020-09-05 DIAGNOSIS — J3081 Allergic rhinitis due to animal (cat) (dog) hair and dander: Secondary | ICD-10-CM | POA: Diagnosis not present

## 2020-09-05 DIAGNOSIS — J3089 Other allergic rhinitis: Secondary | ICD-10-CM | POA: Diagnosis not present

## 2020-09-12 DIAGNOSIS — J3089 Other allergic rhinitis: Secondary | ICD-10-CM | POA: Diagnosis not present

## 2020-09-20 DIAGNOSIS — Z20822 Contact with and (suspected) exposure to covid-19: Secondary | ICD-10-CM | POA: Diagnosis not present

## 2020-10-02 DIAGNOSIS — J3089 Other allergic rhinitis: Secondary | ICD-10-CM | POA: Diagnosis not present

## 2020-10-02 DIAGNOSIS — J3081 Allergic rhinitis due to animal (cat) (dog) hair and dander: Secondary | ICD-10-CM | POA: Diagnosis not present

## 2020-10-02 DIAGNOSIS — J301 Allergic rhinitis due to pollen: Secondary | ICD-10-CM | POA: Diagnosis not present

## 2020-10-10 DIAGNOSIS — J3081 Allergic rhinitis due to animal (cat) (dog) hair and dander: Secondary | ICD-10-CM | POA: Diagnosis not present

## 2020-10-10 DIAGNOSIS — J3089 Other allergic rhinitis: Secondary | ICD-10-CM | POA: Diagnosis not present

## 2020-10-10 DIAGNOSIS — J301 Allergic rhinitis due to pollen: Secondary | ICD-10-CM | POA: Diagnosis not present

## 2020-10-17 DIAGNOSIS — J3081 Allergic rhinitis due to animal (cat) (dog) hair and dander: Secondary | ICD-10-CM | POA: Diagnosis not present

## 2020-10-17 DIAGNOSIS — J3089 Other allergic rhinitis: Secondary | ICD-10-CM | POA: Diagnosis not present

## 2020-10-17 DIAGNOSIS — J301 Allergic rhinitis due to pollen: Secondary | ICD-10-CM | POA: Diagnosis not present

## 2020-11-01 DIAGNOSIS — J3089 Other allergic rhinitis: Secondary | ICD-10-CM | POA: Diagnosis not present

## 2020-11-01 DIAGNOSIS — J301 Allergic rhinitis due to pollen: Secondary | ICD-10-CM | POA: Diagnosis not present

## 2020-11-01 DIAGNOSIS — J3081 Allergic rhinitis due to animal (cat) (dog) hair and dander: Secondary | ICD-10-CM | POA: Diagnosis not present

## 2020-11-14 DIAGNOSIS — J3089 Other allergic rhinitis: Secondary | ICD-10-CM | POA: Diagnosis not present

## 2020-11-14 DIAGNOSIS — J301 Allergic rhinitis due to pollen: Secondary | ICD-10-CM | POA: Diagnosis not present

## 2020-11-14 DIAGNOSIS — J3081 Allergic rhinitis due to animal (cat) (dog) hair and dander: Secondary | ICD-10-CM | POA: Diagnosis not present

## 2020-11-28 DIAGNOSIS — J3081 Allergic rhinitis due to animal (cat) (dog) hair and dander: Secondary | ICD-10-CM | POA: Diagnosis not present

## 2020-11-28 DIAGNOSIS — J301 Allergic rhinitis due to pollen: Secondary | ICD-10-CM | POA: Diagnosis not present

## 2020-11-28 DIAGNOSIS — J3089 Other allergic rhinitis: Secondary | ICD-10-CM | POA: Diagnosis not present

## 2020-12-07 DIAGNOSIS — J301 Allergic rhinitis due to pollen: Secondary | ICD-10-CM | POA: Diagnosis not present

## 2020-12-07 DIAGNOSIS — J3081 Allergic rhinitis due to animal (cat) (dog) hair and dander: Secondary | ICD-10-CM | POA: Diagnosis not present

## 2020-12-12 DIAGNOSIS — J301 Allergic rhinitis due to pollen: Secondary | ICD-10-CM | POA: Diagnosis not present

## 2020-12-12 DIAGNOSIS — J3081 Allergic rhinitis due to animal (cat) (dog) hair and dander: Secondary | ICD-10-CM | POA: Diagnosis not present

## 2020-12-12 DIAGNOSIS — J3089 Other allergic rhinitis: Secondary | ICD-10-CM | POA: Diagnosis not present

## 2020-12-27 DIAGNOSIS — J3081 Allergic rhinitis due to animal (cat) (dog) hair and dander: Secondary | ICD-10-CM | POA: Diagnosis not present

## 2020-12-27 DIAGNOSIS — J3089 Other allergic rhinitis: Secondary | ICD-10-CM | POA: Diagnosis not present

## 2020-12-27 DIAGNOSIS — J301 Allergic rhinitis due to pollen: Secondary | ICD-10-CM | POA: Diagnosis not present

## 2021-01-09 DIAGNOSIS — J301 Allergic rhinitis due to pollen: Secondary | ICD-10-CM | POA: Diagnosis not present

## 2021-01-09 DIAGNOSIS — J3081 Allergic rhinitis due to animal (cat) (dog) hair and dander: Secondary | ICD-10-CM | POA: Diagnosis not present

## 2021-01-09 DIAGNOSIS — J3089 Other allergic rhinitis: Secondary | ICD-10-CM | POA: Diagnosis not present

## 2021-01-25 DIAGNOSIS — J3089 Other allergic rhinitis: Secondary | ICD-10-CM | POA: Diagnosis not present

## 2021-01-25 DIAGNOSIS — J3081 Allergic rhinitis due to animal (cat) (dog) hair and dander: Secondary | ICD-10-CM | POA: Diagnosis not present

## 2021-01-25 DIAGNOSIS — J301 Allergic rhinitis due to pollen: Secondary | ICD-10-CM | POA: Diagnosis not present

## 2021-02-08 DIAGNOSIS — J3081 Allergic rhinitis due to animal (cat) (dog) hair and dander: Secondary | ICD-10-CM | POA: Diagnosis not present

## 2021-02-08 DIAGNOSIS — J301 Allergic rhinitis due to pollen: Secondary | ICD-10-CM | POA: Diagnosis not present

## 2021-02-08 DIAGNOSIS — J3089 Other allergic rhinitis: Secondary | ICD-10-CM | POA: Diagnosis not present

## 2021-02-20 DIAGNOSIS — J301 Allergic rhinitis due to pollen: Secondary | ICD-10-CM | POA: Diagnosis not present

## 2021-02-20 DIAGNOSIS — J3081 Allergic rhinitis due to animal (cat) (dog) hair and dander: Secondary | ICD-10-CM | POA: Diagnosis not present

## 2021-02-20 DIAGNOSIS — J3089 Other allergic rhinitis: Secondary | ICD-10-CM | POA: Diagnosis not present

## 2021-03-02 DIAGNOSIS — Z1231 Encounter for screening mammogram for malignant neoplasm of breast: Secondary | ICD-10-CM | POA: Diagnosis not present

## 2021-03-06 DIAGNOSIS — J3081 Allergic rhinitis due to animal (cat) (dog) hair and dander: Secondary | ICD-10-CM | POA: Diagnosis not present

## 2021-03-06 DIAGNOSIS — J3089 Other allergic rhinitis: Secondary | ICD-10-CM | POA: Diagnosis not present

## 2021-03-06 DIAGNOSIS — J301 Allergic rhinitis due to pollen: Secondary | ICD-10-CM | POA: Diagnosis not present

## 2021-03-13 DIAGNOSIS — J3081 Allergic rhinitis due to animal (cat) (dog) hair and dander: Secondary | ICD-10-CM | POA: Diagnosis not present

## 2021-03-13 DIAGNOSIS — J301 Allergic rhinitis due to pollen: Secondary | ICD-10-CM | POA: Diagnosis not present

## 2021-03-13 DIAGNOSIS — J3089 Other allergic rhinitis: Secondary | ICD-10-CM | POA: Diagnosis not present

## 2021-03-21 DIAGNOSIS — J301 Allergic rhinitis due to pollen: Secondary | ICD-10-CM | POA: Diagnosis not present

## 2021-03-21 DIAGNOSIS — J3089 Other allergic rhinitis: Secondary | ICD-10-CM | POA: Diagnosis not present

## 2021-03-21 DIAGNOSIS — J3081 Allergic rhinitis due to animal (cat) (dog) hair and dander: Secondary | ICD-10-CM | POA: Diagnosis not present

## 2021-03-23 DIAGNOSIS — J301 Allergic rhinitis due to pollen: Secondary | ICD-10-CM | POA: Diagnosis not present

## 2021-03-23 DIAGNOSIS — J3089 Other allergic rhinitis: Secondary | ICD-10-CM | POA: Diagnosis not present

## 2021-03-23 DIAGNOSIS — J3081 Allergic rhinitis due to animal (cat) (dog) hair and dander: Secondary | ICD-10-CM | POA: Diagnosis not present

## 2021-03-27 DIAGNOSIS — J3081 Allergic rhinitis due to animal (cat) (dog) hair and dander: Secondary | ICD-10-CM | POA: Diagnosis not present

## 2021-03-27 DIAGNOSIS — J3089 Other allergic rhinitis: Secondary | ICD-10-CM | POA: Diagnosis not present

## 2021-03-27 DIAGNOSIS — J301 Allergic rhinitis due to pollen: Secondary | ICD-10-CM | POA: Diagnosis not present

## 2021-04-02 DIAGNOSIS — J3089 Other allergic rhinitis: Secondary | ICD-10-CM | POA: Diagnosis not present

## 2021-04-02 DIAGNOSIS — J3081 Allergic rhinitis due to animal (cat) (dog) hair and dander: Secondary | ICD-10-CM | POA: Diagnosis not present

## 2021-04-02 DIAGNOSIS — J301 Allergic rhinitis due to pollen: Secondary | ICD-10-CM | POA: Diagnosis not present

## 2021-04-17 DIAGNOSIS — J301 Allergic rhinitis due to pollen: Secondary | ICD-10-CM | POA: Diagnosis not present

## 2021-04-17 DIAGNOSIS — J3089 Other allergic rhinitis: Secondary | ICD-10-CM | POA: Diagnosis not present

## 2021-04-17 DIAGNOSIS — J3081 Allergic rhinitis due to animal (cat) (dog) hair and dander: Secondary | ICD-10-CM | POA: Diagnosis not present

## 2021-05-03 DIAGNOSIS — J301 Allergic rhinitis due to pollen: Secondary | ICD-10-CM | POA: Diagnosis not present

## 2021-05-03 DIAGNOSIS — J3089 Other allergic rhinitis: Secondary | ICD-10-CM | POA: Diagnosis not present

## 2021-05-03 DIAGNOSIS — J3081 Allergic rhinitis due to animal (cat) (dog) hair and dander: Secondary | ICD-10-CM | POA: Diagnosis not present

## 2021-05-17 DIAGNOSIS — J301 Allergic rhinitis due to pollen: Secondary | ICD-10-CM | POA: Diagnosis not present

## 2021-05-17 DIAGNOSIS — J3081 Allergic rhinitis due to animal (cat) (dog) hair and dander: Secondary | ICD-10-CM | POA: Diagnosis not present

## 2021-05-17 DIAGNOSIS — J3089 Other allergic rhinitis: Secondary | ICD-10-CM | POA: Diagnosis not present

## 2021-05-29 DIAGNOSIS — J3081 Allergic rhinitis due to animal (cat) (dog) hair and dander: Secondary | ICD-10-CM | POA: Diagnosis not present

## 2021-05-29 DIAGNOSIS — J301 Allergic rhinitis due to pollen: Secondary | ICD-10-CM | POA: Diagnosis not present

## 2021-05-29 DIAGNOSIS — J3089 Other allergic rhinitis: Secondary | ICD-10-CM | POA: Diagnosis not present

## 2021-06-01 DIAGNOSIS — J3081 Allergic rhinitis due to animal (cat) (dog) hair and dander: Secondary | ICD-10-CM | POA: Diagnosis not present

## 2021-06-01 DIAGNOSIS — J301 Allergic rhinitis due to pollen: Secondary | ICD-10-CM | POA: Diagnosis not present

## 2021-06-01 DIAGNOSIS — R0602 Shortness of breath: Secondary | ICD-10-CM | POA: Diagnosis not present

## 2021-06-01 DIAGNOSIS — J3089 Other allergic rhinitis: Secondary | ICD-10-CM | POA: Diagnosis not present

## 2021-06-04 DIAGNOSIS — L821 Other seborrheic keratosis: Secondary | ICD-10-CM | POA: Diagnosis not present

## 2021-06-04 DIAGNOSIS — D225 Melanocytic nevi of trunk: Secondary | ICD-10-CM | POA: Diagnosis not present

## 2021-06-04 DIAGNOSIS — L814 Other melanin hyperpigmentation: Secondary | ICD-10-CM | POA: Diagnosis not present

## 2021-06-04 DIAGNOSIS — Z86018 Personal history of other benign neoplasm: Secondary | ICD-10-CM | POA: Diagnosis not present

## 2021-06-04 DIAGNOSIS — Z23 Encounter for immunization: Secondary | ICD-10-CM | POA: Diagnosis not present

## 2021-06-08 DIAGNOSIS — Z1151 Encounter for screening for human papillomavirus (HPV): Secondary | ICD-10-CM | POA: Diagnosis not present

## 2021-06-08 DIAGNOSIS — Z124 Encounter for screening for malignant neoplasm of cervix: Secondary | ICD-10-CM | POA: Diagnosis not present

## 2021-06-08 DIAGNOSIS — Z6826 Body mass index (BMI) 26.0-26.9, adult: Secondary | ICD-10-CM | POA: Diagnosis not present

## 2021-06-08 DIAGNOSIS — Z01419 Encounter for gynecological examination (general) (routine) without abnormal findings: Secondary | ICD-10-CM | POA: Diagnosis not present

## 2021-06-08 DIAGNOSIS — Z130 Encounter for screening for diseases of the blood and blood-forming organs and certain disorders involving the immune mechanism: Secondary | ICD-10-CM | POA: Diagnosis not present

## 2021-06-25 DIAGNOSIS — J301 Allergic rhinitis due to pollen: Secondary | ICD-10-CM | POA: Diagnosis not present

## 2021-06-25 DIAGNOSIS — J3089 Other allergic rhinitis: Secondary | ICD-10-CM | POA: Diagnosis not present

## 2021-06-25 DIAGNOSIS — J3081 Allergic rhinitis due to animal (cat) (dog) hair and dander: Secondary | ICD-10-CM | POA: Diagnosis not present

## 2021-07-23 DIAGNOSIS — J3089 Other allergic rhinitis: Secondary | ICD-10-CM | POA: Diagnosis not present

## 2021-07-23 DIAGNOSIS — J3081 Allergic rhinitis due to animal (cat) (dog) hair and dander: Secondary | ICD-10-CM | POA: Diagnosis not present

## 2021-07-23 DIAGNOSIS — J301 Allergic rhinitis due to pollen: Secondary | ICD-10-CM | POA: Diagnosis not present

## 2021-08-14 DIAGNOSIS — J3081 Allergic rhinitis due to animal (cat) (dog) hair and dander: Secondary | ICD-10-CM | POA: Diagnosis not present

## 2021-08-14 DIAGNOSIS — J301 Allergic rhinitis due to pollen: Secondary | ICD-10-CM | POA: Diagnosis not present

## 2021-08-14 DIAGNOSIS — J3089 Other allergic rhinitis: Secondary | ICD-10-CM | POA: Diagnosis not present

## 2021-09-11 DIAGNOSIS — J3089 Other allergic rhinitis: Secondary | ICD-10-CM | POA: Diagnosis not present

## 2021-09-11 DIAGNOSIS — J301 Allergic rhinitis due to pollen: Secondary | ICD-10-CM | POA: Diagnosis not present

## 2021-09-11 DIAGNOSIS — J3081 Allergic rhinitis due to animal (cat) (dog) hair and dander: Secondary | ICD-10-CM | POA: Diagnosis not present

## 2021-09-12 DIAGNOSIS — J3089 Other allergic rhinitis: Secondary | ICD-10-CM | POA: Diagnosis not present

## 2021-10-09 DIAGNOSIS — J3089 Other allergic rhinitis: Secondary | ICD-10-CM | POA: Diagnosis not present

## 2021-10-09 DIAGNOSIS — J301 Allergic rhinitis due to pollen: Secondary | ICD-10-CM | POA: Diagnosis not present

## 2021-10-09 DIAGNOSIS — J3081 Allergic rhinitis due to animal (cat) (dog) hair and dander: Secondary | ICD-10-CM | POA: Diagnosis not present

## 2021-10-16 DIAGNOSIS — J3089 Other allergic rhinitis: Secondary | ICD-10-CM | POA: Diagnosis not present

## 2021-10-16 DIAGNOSIS — J3081 Allergic rhinitis due to animal (cat) (dog) hair and dander: Secondary | ICD-10-CM | POA: Diagnosis not present

## 2021-10-16 DIAGNOSIS — J301 Allergic rhinitis due to pollen: Secondary | ICD-10-CM | POA: Diagnosis not present

## 2021-10-18 DIAGNOSIS — J3089 Other allergic rhinitis: Secondary | ICD-10-CM | POA: Diagnosis not present

## 2021-10-18 DIAGNOSIS — J301 Allergic rhinitis due to pollen: Secondary | ICD-10-CM | POA: Diagnosis not present

## 2021-10-18 DIAGNOSIS — J3081 Allergic rhinitis due to animal (cat) (dog) hair and dander: Secondary | ICD-10-CM | POA: Diagnosis not present

## 2021-10-23 DIAGNOSIS — J301 Allergic rhinitis due to pollen: Secondary | ICD-10-CM | POA: Diagnosis not present

## 2021-10-23 DIAGNOSIS — J3081 Allergic rhinitis due to animal (cat) (dog) hair and dander: Secondary | ICD-10-CM | POA: Diagnosis not present

## 2021-10-23 DIAGNOSIS — J3089 Other allergic rhinitis: Secondary | ICD-10-CM | POA: Diagnosis not present

## 2021-10-25 DIAGNOSIS — J3081 Allergic rhinitis due to animal (cat) (dog) hair and dander: Secondary | ICD-10-CM | POA: Diagnosis not present

## 2021-10-25 DIAGNOSIS — J301 Allergic rhinitis due to pollen: Secondary | ICD-10-CM | POA: Diagnosis not present

## 2021-10-25 DIAGNOSIS — J3089 Other allergic rhinitis: Secondary | ICD-10-CM | POA: Diagnosis not present

## 2021-11-22 DIAGNOSIS — J3081 Allergic rhinitis due to animal (cat) (dog) hair and dander: Secondary | ICD-10-CM | POA: Diagnosis not present

## 2021-11-22 DIAGNOSIS — J3089 Other allergic rhinitis: Secondary | ICD-10-CM | POA: Diagnosis not present

## 2021-11-22 DIAGNOSIS — J301 Allergic rhinitis due to pollen: Secondary | ICD-10-CM | POA: Diagnosis not present

## 2021-11-28 DIAGNOSIS — J3081 Allergic rhinitis due to animal (cat) (dog) hair and dander: Secondary | ICD-10-CM | POA: Diagnosis not present

## 2021-11-28 DIAGNOSIS — J301 Allergic rhinitis due to pollen: Secondary | ICD-10-CM | POA: Diagnosis not present

## 2021-12-06 DIAGNOSIS — J3081 Allergic rhinitis due to animal (cat) (dog) hair and dander: Secondary | ICD-10-CM | POA: Diagnosis not present

## 2021-12-06 DIAGNOSIS — J3089 Other allergic rhinitis: Secondary | ICD-10-CM | POA: Diagnosis not present

## 2021-12-06 DIAGNOSIS — J301 Allergic rhinitis due to pollen: Secondary | ICD-10-CM | POA: Diagnosis not present

## 2022-01-01 DIAGNOSIS — J301 Allergic rhinitis due to pollen: Secondary | ICD-10-CM | POA: Diagnosis not present

## 2022-01-01 DIAGNOSIS — J3089 Other allergic rhinitis: Secondary | ICD-10-CM | POA: Diagnosis not present

## 2022-01-01 DIAGNOSIS — J3081 Allergic rhinitis due to animal (cat) (dog) hair and dander: Secondary | ICD-10-CM | POA: Diagnosis not present

## 2022-01-03 DIAGNOSIS — J301 Allergic rhinitis due to pollen: Secondary | ICD-10-CM | POA: Diagnosis not present

## 2022-01-03 DIAGNOSIS — J3081 Allergic rhinitis due to animal (cat) (dog) hair and dander: Secondary | ICD-10-CM | POA: Diagnosis not present

## 2022-01-03 DIAGNOSIS — J3089 Other allergic rhinitis: Secondary | ICD-10-CM | POA: Diagnosis not present

## 2022-01-07 DIAGNOSIS — J020 Streptococcal pharyngitis: Secondary | ICD-10-CM | POA: Diagnosis not present

## 2022-01-15 DIAGNOSIS — J3081 Allergic rhinitis due to animal (cat) (dog) hair and dander: Secondary | ICD-10-CM | POA: Diagnosis not present

## 2022-01-15 DIAGNOSIS — J3089 Other allergic rhinitis: Secondary | ICD-10-CM | POA: Diagnosis not present

## 2022-01-15 DIAGNOSIS — J301 Allergic rhinitis due to pollen: Secondary | ICD-10-CM | POA: Diagnosis not present

## 2022-01-17 DIAGNOSIS — J3081 Allergic rhinitis due to animal (cat) (dog) hair and dander: Secondary | ICD-10-CM | POA: Diagnosis not present

## 2022-01-17 DIAGNOSIS — J301 Allergic rhinitis due to pollen: Secondary | ICD-10-CM | POA: Diagnosis not present

## 2022-01-17 DIAGNOSIS — J3089 Other allergic rhinitis: Secondary | ICD-10-CM | POA: Diagnosis not present

## 2022-02-19 DIAGNOSIS — J3089 Other allergic rhinitis: Secondary | ICD-10-CM | POA: Diagnosis not present

## 2022-02-19 DIAGNOSIS — J301 Allergic rhinitis due to pollen: Secondary | ICD-10-CM | POA: Diagnosis not present

## 2022-02-19 DIAGNOSIS — J3081 Allergic rhinitis due to animal (cat) (dog) hair and dander: Secondary | ICD-10-CM | POA: Diagnosis not present

## 2022-02-22 DIAGNOSIS — J3089 Other allergic rhinitis: Secondary | ICD-10-CM | POA: Diagnosis not present

## 2022-02-22 DIAGNOSIS — J301 Allergic rhinitis due to pollen: Secondary | ICD-10-CM | POA: Diagnosis not present

## 2022-02-22 DIAGNOSIS — J3081 Allergic rhinitis due to animal (cat) (dog) hair and dander: Secondary | ICD-10-CM | POA: Diagnosis not present

## 2022-02-26 DIAGNOSIS — J3089 Other allergic rhinitis: Secondary | ICD-10-CM | POA: Diagnosis not present

## 2022-02-26 DIAGNOSIS — J3081 Allergic rhinitis due to animal (cat) (dog) hair and dander: Secondary | ICD-10-CM | POA: Diagnosis not present

## 2022-02-26 DIAGNOSIS — J301 Allergic rhinitis due to pollen: Secondary | ICD-10-CM | POA: Diagnosis not present

## 2022-02-28 DIAGNOSIS — J301 Allergic rhinitis due to pollen: Secondary | ICD-10-CM | POA: Diagnosis not present

## 2022-02-28 DIAGNOSIS — J3089 Other allergic rhinitis: Secondary | ICD-10-CM | POA: Diagnosis not present

## 2022-02-28 DIAGNOSIS — J3081 Allergic rhinitis due to animal (cat) (dog) hair and dander: Secondary | ICD-10-CM | POA: Diagnosis not present

## 2022-03-21 DIAGNOSIS — J301 Allergic rhinitis due to pollen: Secondary | ICD-10-CM | POA: Diagnosis not present

## 2022-03-21 DIAGNOSIS — J3089 Other allergic rhinitis: Secondary | ICD-10-CM | POA: Diagnosis not present

## 2022-03-21 DIAGNOSIS — J3081 Allergic rhinitis due to animal (cat) (dog) hair and dander: Secondary | ICD-10-CM | POA: Diagnosis not present

## 2022-04-18 DIAGNOSIS — J3081 Allergic rhinitis due to animal (cat) (dog) hair and dander: Secondary | ICD-10-CM | POA: Diagnosis not present

## 2022-04-18 DIAGNOSIS — J301 Allergic rhinitis due to pollen: Secondary | ICD-10-CM | POA: Diagnosis not present

## 2022-04-18 DIAGNOSIS — J3089 Other allergic rhinitis: Secondary | ICD-10-CM | POA: Diagnosis not present

## 2022-04-30 DIAGNOSIS — Z0000 Encounter for general adult medical examination without abnormal findings: Secondary | ICD-10-CM | POA: Diagnosis not present

## 2022-04-30 DIAGNOSIS — Z23 Encounter for immunization: Secondary | ICD-10-CM | POA: Diagnosis not present

## 2022-05-14 DIAGNOSIS — J3081 Allergic rhinitis due to animal (cat) (dog) hair and dander: Secondary | ICD-10-CM | POA: Diagnosis not present

## 2022-05-14 DIAGNOSIS — J301 Allergic rhinitis due to pollen: Secondary | ICD-10-CM | POA: Diagnosis not present

## 2022-05-14 DIAGNOSIS — J3089 Other allergic rhinitis: Secondary | ICD-10-CM | POA: Diagnosis not present

## 2022-05-31 DIAGNOSIS — J3081 Allergic rhinitis due to animal (cat) (dog) hair and dander: Secondary | ICD-10-CM | POA: Diagnosis not present

## 2022-05-31 DIAGNOSIS — J301 Allergic rhinitis due to pollen: Secondary | ICD-10-CM | POA: Diagnosis not present

## 2022-05-31 DIAGNOSIS — J3089 Other allergic rhinitis: Secondary | ICD-10-CM | POA: Diagnosis not present

## 2022-05-31 DIAGNOSIS — R0602 Shortness of breath: Secondary | ICD-10-CM | POA: Diagnosis not present

## 2022-06-05 DIAGNOSIS — D225 Melanocytic nevi of trunk: Secondary | ICD-10-CM | POA: Diagnosis not present

## 2022-06-05 DIAGNOSIS — L578 Other skin changes due to chronic exposure to nonionizing radiation: Secondary | ICD-10-CM | POA: Diagnosis not present

## 2022-06-05 DIAGNOSIS — L821 Other seborrheic keratosis: Secondary | ICD-10-CM | POA: Diagnosis not present

## 2022-06-05 DIAGNOSIS — L814 Other melanin hyperpigmentation: Secondary | ICD-10-CM | POA: Diagnosis not present

## 2022-06-12 DIAGNOSIS — J3089 Other allergic rhinitis: Secondary | ICD-10-CM | POA: Diagnosis not present

## 2022-06-12 DIAGNOSIS — J3081 Allergic rhinitis due to animal (cat) (dog) hair and dander: Secondary | ICD-10-CM | POA: Diagnosis not present

## 2022-06-12 DIAGNOSIS — J301 Allergic rhinitis due to pollen: Secondary | ICD-10-CM | POA: Diagnosis not present

## 2022-06-24 DIAGNOSIS — Z1231 Encounter for screening mammogram for malignant neoplasm of breast: Secondary | ICD-10-CM | POA: Diagnosis not present

## 2022-06-24 DIAGNOSIS — Z01419 Encounter for gynecological examination (general) (routine) without abnormal findings: Secondary | ICD-10-CM | POA: Diagnosis not present

## 2022-06-24 DIAGNOSIS — Z1389 Encounter for screening for other disorder: Secondary | ICD-10-CM | POA: Diagnosis not present

## 2022-07-10 DIAGNOSIS — J301 Allergic rhinitis due to pollen: Secondary | ICD-10-CM | POA: Diagnosis not present

## 2022-07-10 DIAGNOSIS — J3089 Other allergic rhinitis: Secondary | ICD-10-CM | POA: Diagnosis not present

## 2022-07-10 DIAGNOSIS — J3081 Allergic rhinitis due to animal (cat) (dog) hair and dander: Secondary | ICD-10-CM | POA: Diagnosis not present

## 2022-08-08 DIAGNOSIS — J3081 Allergic rhinitis due to animal (cat) (dog) hair and dander: Secondary | ICD-10-CM | POA: Diagnosis not present

## 2022-08-08 DIAGNOSIS — J301 Allergic rhinitis due to pollen: Secondary | ICD-10-CM | POA: Diagnosis not present

## 2022-08-08 DIAGNOSIS — J3089 Other allergic rhinitis: Secondary | ICD-10-CM | POA: Diagnosis not present

## 2022-08-14 DIAGNOSIS — J3081 Allergic rhinitis due to animal (cat) (dog) hair and dander: Secondary | ICD-10-CM | POA: Diagnosis not present

## 2022-08-14 DIAGNOSIS — J301 Allergic rhinitis due to pollen: Secondary | ICD-10-CM | POA: Diagnosis not present

## 2022-08-15 DIAGNOSIS — J3089 Other allergic rhinitis: Secondary | ICD-10-CM | POA: Diagnosis not present

## 2022-09-06 DIAGNOSIS — J3081 Allergic rhinitis due to animal (cat) (dog) hair and dander: Secondary | ICD-10-CM | POA: Diagnosis not present

## 2022-09-06 DIAGNOSIS — J3089 Other allergic rhinitis: Secondary | ICD-10-CM | POA: Diagnosis not present

## 2022-09-06 DIAGNOSIS — J301 Allergic rhinitis due to pollen: Secondary | ICD-10-CM | POA: Diagnosis not present

## 2022-10-02 DIAGNOSIS — J3081 Allergic rhinitis due to animal (cat) (dog) hair and dander: Secondary | ICD-10-CM | POA: Diagnosis not present

## 2022-10-02 DIAGNOSIS — J301 Allergic rhinitis due to pollen: Secondary | ICD-10-CM | POA: Diagnosis not present

## 2022-10-02 DIAGNOSIS — J3089 Other allergic rhinitis: Secondary | ICD-10-CM | POA: Diagnosis not present

## 2022-10-30 DIAGNOSIS — J029 Acute pharyngitis, unspecified: Secondary | ICD-10-CM | POA: Diagnosis not present

## 2022-10-30 DIAGNOSIS — J020 Streptococcal pharyngitis: Secondary | ICD-10-CM | POA: Diagnosis not present

## 2022-10-30 DIAGNOSIS — J3089 Other allergic rhinitis: Secondary | ICD-10-CM | POA: Diagnosis not present

## 2022-10-30 DIAGNOSIS — J3081 Allergic rhinitis due to animal (cat) (dog) hair and dander: Secondary | ICD-10-CM | POA: Diagnosis not present

## 2022-10-30 DIAGNOSIS — J301 Allergic rhinitis due to pollen: Secondary | ICD-10-CM | POA: Diagnosis not present

## 2022-11-07 DIAGNOSIS — J3089 Other allergic rhinitis: Secondary | ICD-10-CM | POA: Diagnosis not present

## 2022-11-13 DIAGNOSIS — J301 Allergic rhinitis due to pollen: Secondary | ICD-10-CM | POA: Diagnosis not present

## 2022-11-13 DIAGNOSIS — J3081 Allergic rhinitis due to animal (cat) (dog) hair and dander: Secondary | ICD-10-CM | POA: Diagnosis not present

## 2022-11-13 DIAGNOSIS — J3089 Other allergic rhinitis: Secondary | ICD-10-CM | POA: Diagnosis not present

## 2022-11-19 DIAGNOSIS — J301 Allergic rhinitis due to pollen: Secondary | ICD-10-CM | POA: Diagnosis not present

## 2022-11-19 DIAGNOSIS — J3089 Other allergic rhinitis: Secondary | ICD-10-CM | POA: Diagnosis not present

## 2022-11-19 DIAGNOSIS — J3081 Allergic rhinitis due to animal (cat) (dog) hair and dander: Secondary | ICD-10-CM | POA: Diagnosis not present

## 2022-12-19 DIAGNOSIS — J301 Allergic rhinitis due to pollen: Secondary | ICD-10-CM | POA: Diagnosis not present

## 2022-12-19 DIAGNOSIS — J3089 Other allergic rhinitis: Secondary | ICD-10-CM | POA: Diagnosis not present

## 2022-12-19 DIAGNOSIS — J3081 Allergic rhinitis due to animal (cat) (dog) hair and dander: Secondary | ICD-10-CM | POA: Diagnosis not present

## 2023-01-08 DIAGNOSIS — J386 Stenosis of larynx: Secondary | ICD-10-CM | POA: Diagnosis not present

## 2023-01-15 DIAGNOSIS — J3081 Allergic rhinitis due to animal (cat) (dog) hair and dander: Secondary | ICD-10-CM | POA: Diagnosis not present

## 2023-01-15 DIAGNOSIS — J301 Allergic rhinitis due to pollen: Secondary | ICD-10-CM | POA: Diagnosis not present

## 2023-01-15 DIAGNOSIS — J3089 Other allergic rhinitis: Secondary | ICD-10-CM | POA: Diagnosis not present

## 2023-01-22 DIAGNOSIS — J301 Allergic rhinitis due to pollen: Secondary | ICD-10-CM | POA: Diagnosis not present

## 2023-01-22 DIAGNOSIS — J3081 Allergic rhinitis due to animal (cat) (dog) hair and dander: Secondary | ICD-10-CM | POA: Diagnosis not present

## 2023-01-22 DIAGNOSIS — J3089 Other allergic rhinitis: Secondary | ICD-10-CM | POA: Diagnosis not present

## 2023-01-29 DIAGNOSIS — J3081 Allergic rhinitis due to animal (cat) (dog) hair and dander: Secondary | ICD-10-CM | POA: Diagnosis not present

## 2023-01-29 DIAGNOSIS — J301 Allergic rhinitis due to pollen: Secondary | ICD-10-CM | POA: Diagnosis not present

## 2023-01-29 DIAGNOSIS — J3089 Other allergic rhinitis: Secondary | ICD-10-CM | POA: Diagnosis not present

## 2023-02-05 DIAGNOSIS — J301 Allergic rhinitis due to pollen: Secondary | ICD-10-CM | POA: Diagnosis not present

## 2023-02-05 DIAGNOSIS — J3089 Other allergic rhinitis: Secondary | ICD-10-CM | POA: Diagnosis not present

## 2023-02-05 DIAGNOSIS — J3081 Allergic rhinitis due to animal (cat) (dog) hair and dander: Secondary | ICD-10-CM | POA: Diagnosis not present

## 2023-02-11 DIAGNOSIS — J3089 Other allergic rhinitis: Secondary | ICD-10-CM | POA: Diagnosis not present

## 2023-02-11 DIAGNOSIS — J301 Allergic rhinitis due to pollen: Secondary | ICD-10-CM | POA: Diagnosis not present

## 2023-02-11 DIAGNOSIS — J3081 Allergic rhinitis due to animal (cat) (dog) hair and dander: Secondary | ICD-10-CM | POA: Diagnosis not present

## 2023-03-11 DIAGNOSIS — J3081 Allergic rhinitis due to animal (cat) (dog) hair and dander: Secondary | ICD-10-CM | POA: Diagnosis not present

## 2023-03-11 DIAGNOSIS — J3089 Other allergic rhinitis: Secondary | ICD-10-CM | POA: Diagnosis not present

## 2023-03-11 DIAGNOSIS — J301 Allergic rhinitis due to pollen: Secondary | ICD-10-CM | POA: Diagnosis not present

## 2023-04-07 DIAGNOSIS — J301 Allergic rhinitis due to pollen: Secondary | ICD-10-CM | POA: Diagnosis not present

## 2023-04-07 DIAGNOSIS — J3089 Other allergic rhinitis: Secondary | ICD-10-CM | POA: Diagnosis not present

## 2023-04-07 DIAGNOSIS — J3081 Allergic rhinitis due to animal (cat) (dog) hair and dander: Secondary | ICD-10-CM | POA: Diagnosis not present

## 2023-05-01 ENCOUNTER — Other Ambulatory Visit: Payer: Self-pay | Admitting: Otolaryngology

## 2023-05-07 DIAGNOSIS — J3089 Other allergic rhinitis: Secondary | ICD-10-CM | POA: Diagnosis not present

## 2023-05-07 DIAGNOSIS — J301 Allergic rhinitis due to pollen: Secondary | ICD-10-CM | POA: Diagnosis not present

## 2023-05-07 DIAGNOSIS — J3081 Allergic rhinitis due to animal (cat) (dog) hair and dander: Secondary | ICD-10-CM | POA: Diagnosis not present

## 2023-05-09 ENCOUNTER — Other Ambulatory Visit: Payer: Self-pay

## 2023-05-09 ENCOUNTER — Encounter (HOSPITAL_COMMUNITY): Payer: Self-pay | Admitting: Otolaryngology

## 2023-05-09 NOTE — Progress Notes (Signed)
Mrs. Breer denies chest pain or shortness of breath.  Patient denies having any s/s of Covid in her household, also denies any known exposure to Covid. Mrs. Rule denies  any s/s of upper or lower respiratory infection in the past 8 weeks.   Mrs Aurich's PCP is Jarrett Soho, NP.

## 2023-05-12 ENCOUNTER — Ambulatory Visit (HOSPITAL_COMMUNITY): Payer: BC Managed Care – PPO

## 2023-05-12 ENCOUNTER — Ambulatory Visit (HOSPITAL_COMMUNITY)
Admission: RE | Admit: 2023-05-12 | Discharge: 2023-05-12 | Disposition: A | Discharge status: Home / Self Care | Payer: BC Managed Care – PPO | Attending: Otolaryngology | Admitting: Otolaryngology

## 2023-05-12 ENCOUNTER — Other Ambulatory Visit: Payer: Self-pay

## 2023-05-12 ENCOUNTER — Encounter (HOSPITAL_COMMUNITY)
Admission: RE | Disposition: A | Discharge status: Home / Self Care | Payer: Self-pay | Source: Home / Self Care | Attending: Otolaryngology

## 2023-05-12 ENCOUNTER — Ambulatory Visit (HOSPITAL_COMMUNITY): Payer: Self-pay

## 2023-05-12 ENCOUNTER — Encounter (HOSPITAL_COMMUNITY): Payer: Self-pay | Admitting: Otolaryngology

## 2023-05-12 DIAGNOSIS — Z1152 Encounter for screening for COVID-19: Secondary | ICD-10-CM | POA: Diagnosis not present

## 2023-05-12 DIAGNOSIS — K219 Gastro-esophageal reflux disease without esophagitis: Secondary | ICD-10-CM | POA: Diagnosis not present

## 2023-05-12 DIAGNOSIS — J386 Stenosis of larynx: Secondary | ICD-10-CM | POA: Insufficient documentation

## 2023-05-12 DIAGNOSIS — F419 Anxiety disorder, unspecified: Secondary | ICD-10-CM | POA: Diagnosis not present

## 2023-05-12 HISTORY — DX: Dyspnea, unspecified: R06.00

## 2023-05-12 HISTORY — PX: MICROLARYNGOSCOPY WITH CO2 LASER AND EXCISION OF VOCAL CORD LESION: SHX5970

## 2023-05-12 LAB — CBC
HCT: 36.4 % (ref 36.0–46.0)
Hemoglobin: 11.9 g/dL — ABNORMAL LOW (ref 12.0–15.0)
MCH: 28.9 pg (ref 26.0–34.0)
MCHC: 32.7 g/dL (ref 30.0–36.0)
MCV: 88.3 fL (ref 80.0–100.0)
Platelets: 410 10*3/uL — ABNORMAL HIGH (ref 150–400)
RBC: 4.12 MIL/uL (ref 3.87–5.11)
RDW: 12.3 % (ref 11.5–15.5)
WBC: 5.1 10*3/uL (ref 4.0–10.5)
nRBC: 0 % (ref 0.0–0.2)

## 2023-05-12 LAB — POCT PREGNANCY, URINE: Preg Test, Ur: NEGATIVE

## 2023-05-12 LAB — SARS CORONAVIRUS 2 BY RT PCR: SARS Coronavirus 2 by RT PCR: NEGATIVE

## 2023-05-12 SURGERY — MICROLARYNGOSCOPY WITH CO2 LASER AND EXCISION OF VOCAL CORD LESION
Anesthesia: General | Site: Throat

## 2023-05-12 MED ORDER — LACTATED RINGERS IV SOLN: INTRAVENOUS | Status: DC

## 2023-05-12 MED ORDER — PROPOFOL 10 MG/ML IV BOLUS
INTRAVENOUS | Status: DC | PRN
  Administered 2023-05-12: 150 mg via INTRAVENOUS
  Administered 2023-05-12 (×2): 50 mg via INTRAVENOUS
  Administered 2023-05-12: 200 ug/kg/min via INTRAVENOUS

## 2023-05-12 MED ORDER — EPINEPHRINE PF 1 MG/ML IJ SOLN
INTRAMUSCULAR | Status: DC | PRN
  Administered 2023-05-12: 10 mL via ENDOTRACHEOPULMONARY

## 2023-05-12 MED ORDER — LIDOCAINE 2% (20 MG/ML) 5 ML SYRINGE
INTRAMUSCULAR | Status: DC | PRN
  Administered 2023-05-12: 60 mg via INTRAVENOUS

## 2023-05-12 MED ORDER — MITOMYCIN-C INJECTION USE IN OR ONLY (0.4 MG/ML)
0.5000 mL | Freq: Once | INTRAVENOUS | Status: AC
  Administered 2023-05-12: .5 mL via OPHTHALMIC
  Filled 2023-05-12: qty 0.5

## 2023-05-12 MED ORDER — ONDANSETRON HCL 4 MG/2ML IJ SOLN
INTRAMUSCULAR | Status: DC | PRN
  Administered 2023-05-12: 4 mg via INTRAVENOUS

## 2023-05-12 MED ORDER — FENTANYL CITRATE (PF) 250 MCG/5ML IJ SOLN
INTRAMUSCULAR | Status: DC | PRN
  Administered 2023-05-12 (×3): 50 ug via INTRAVENOUS

## 2023-05-12 MED ORDER — CHLORHEXIDINE GLUCONATE 0.12 % MT SOLN
15.0000 mL | Freq: Once | OROMUCOSAL | Status: AC
  Administered 2023-05-12: 15 mL via OROMUCOSAL
  Filled 2023-05-12: qty 15

## 2023-05-12 MED ORDER — DEXAMETHASONE SODIUM PHOSPHATE 10 MG/ML IJ SOLN
INTRAMUSCULAR | Status: DC | PRN
  Administered 2023-05-12: 10 mg via INTRAVENOUS

## 2023-05-12 MED ORDER — 0.9 % SODIUM CHLORIDE (POUR BTL) OPTIME
TOPICAL | Status: DC | PRN
Start: 2023-05-12 — End: 2023-05-12
  Administered 2023-05-12: 1000 mL

## 2023-05-12 MED ORDER — SODIUM CHLORIDE 0.9 % IV SOLN
0.0125 ug/kg/min | INTRAVENOUS | Status: DC
Start: 2023-05-12 — End: 2023-05-12

## 2023-05-12 MED ORDER — FENTANYL CITRATE (PF) 250 MCG/5ML IJ SOLN
INTRAMUSCULAR | Status: AC
  Filled 2023-05-12: qty 5

## 2023-05-12 MED ORDER — ORAL CARE MOUTH RINSE: 15.0000 mL | Freq: Once | OROMUCOSAL | Status: AC

## 2023-05-12 MED ORDER — PHENYLEPHRINE 80 MCG/ML (10ML) SYRINGE FOR IV PUSH (FOR BLOOD PRESSURE SUPPORT)
PREFILLED_SYRINGE | INTRAVENOUS | Status: DC | PRN
  Administered 2023-05-12: 80 ug via INTRAVENOUS

## 2023-05-12 MED ORDER — SUCCINYLCHOLINE CHLORIDE 200 MG/10ML IV SOSY
PREFILLED_SYRINGE | INTRAVENOUS | Status: DC | PRN
  Administered 2023-05-12: 80 mg via INTRAVENOUS

## 2023-05-12 MED ORDER — EPINEPHRINE HCL (NASAL) 0.1 % NA SOLN
NASAL | Status: AC
  Filled 2023-05-12: qty 30

## 2023-05-12 SURGICAL SUPPLY — 30 items
BAG COUNTER SPONGE SURGICOUNT (BAG) ×2 IMPLANT
BAG SPNG CNTER NS LX DISP (BAG) ×1
BALLN PULM 15 16.5 18X75 (BALLOONS) ×1
BALLOON PULM 15 16.5 18X75 (BALLOONS) IMPLANT
BNDG EYE OVAL 2 1/8 X 2 5/8 (GAUZE/BANDAGES/DRESSINGS) ×4 IMPLANT
CANISTER SUCT 3000ML PPV (MISCELLANEOUS) ×2 IMPLANT
CNTNR URN SCR LID CUP LEK RST (MISCELLANEOUS) IMPLANT
CONT SPEC 4OZ STRL OR WHT (MISCELLANEOUS)
COVER BACK TABLE 60X90IN (DRAPES) ×2 IMPLANT
COVER MAYO STAND STRL (DRAPES) ×2 IMPLANT
DRAPE HALF SHEET 40X57 (DRAPES) ×2 IMPLANT
GAUZE SPONGE 4X4 12PLY STRL (GAUZE/BANDAGES/DRESSINGS) ×2 IMPLANT
GLOVE BIO SURGEON STRL SZ7.5 (GLOVE) ×2 IMPLANT
GOWN STRL REUS W/ TWL LRG LVL3 (GOWN DISPOSABLE) IMPLANT
GOWN STRL REUS W/TWL LRG LVL3 (GOWN DISPOSABLE)
KIT BASIN OR (CUSTOM PROCEDURE TRAY) ×2 IMPLANT
KIT TURNOVER KIT B (KITS) ×2 IMPLANT
NDL HYPO 25GX1X1/2 BEV (NEEDLE) IMPLANT
NEEDLE HYPO 25GX1X1/2 BEV (NEEDLE)
NS IRRIG 1000ML POUR BTL (IV SOLUTION) ×2 IMPLANT
PAD ARMBOARD 7.5X6 YLW CONV (MISCELLANEOUS) ×4 IMPLANT
PATTIES SURGICAL .5 X1 (DISPOSABLE) ×2 IMPLANT
POSITIONER HEAD DONUT 9IN (MISCELLANEOUS) IMPLANT
SOL ANTI FOG 6CC (MISCELLANEOUS) ×2 IMPLANT
SURGILUBE 2OZ TUBE FLIPTOP (MISCELLANEOUS) IMPLANT
SUT SILK 2 0 PERMA HAND 18 BK (SUTURE) IMPLANT
SYR GAUGE ASSEMBLY ALLIANCE II (MISCELLANEOUS) IMPLANT
TOWEL GREEN STERILE FF (TOWEL DISPOSABLE) ×2 IMPLANT
TUBE CONNECTING 12X1/4 (SUCTIONS) ×2 IMPLANT
WATER STERILE IRR 1000ML POUR (IV SOLUTION) ×2 IMPLANT

## 2023-05-12 NOTE — H&P (Signed)
Denise Barr is an 52 y.o. female.   Chief Complaint: Subglottic stenosis HPI: 52 year old female with idiopathic subglottic stenosis treated previously with dilation, last time being in 2017.  She has had recurrence of obstructive breathing.  Past Medical History:  Diagnosis Date   Anemia 12/01/2009   with pregnancy only   Anxiety    situational   Dyspnea    with exertion   Eczema    GERD (gastroesophageal reflux disease)    occasional    History of fainting    pt. 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    Past Surgical History:  Procedure Laterality Date   BRAVO PH STUDY N/A 10/22/2013   Procedure: BRAVO PH STUDY;  Surgeon: Theda Belfast, MD;  Location: [facility];  Service: Endoscopy;  Laterality: N/A;   DIRECT LARYNGOSCOPY N/A 01/22/2016   Procedure: DIRECT LARYNGOSCOPY WITH CO2 Laser ;  Surgeon: Christia Reading, MD;  Location: [facility];  Service: ENT;  Laterality: N/A;  Micro Direct Laryngoscopy with Jet Ventilation   EPIDURAL BLOCK INJECTION     x 3 with childbirth   ESOPHAGOGASTRODUODENOSCOPY N/A 10/22/2013   Procedure: ESOPHAGOGASTRODUODENOSCOPY (EGD);  Surgeon: Theda Belfast, MD;  Location: [facility];  Service: Endoscopy;  Laterality: N/A;   WISDOM TOOTH EXTRACTION  1995    History reviewed. No pertinent family history. Social History:  reports that she has never smoked. She has never used smokeless tobacco. She reports current alcohol use. She reports that she does not use drugs.  Allergies: No Known Allergies  Medications Prior to Admission  Medication Sig Dispense Refill   esomeprazole (NEXIUM) 40 MG capsule Take 40 mg by mouth 2 (two) times daily before a meal.     fluticasone (FLONASE) 50 MCG/ACT nasal spray Place 1 spray into both nostrils daily as needed for allergies or rhinitis.     levocetirizine (XYZAL) 5 MG tablet Take 5 mg by mouth every evening.  5    Results for  orders placed or performed during the hospital encounter of 05/12/23 (from the past 48 hour(s))  SARS Coronavirus 2 by RT PCR (hospital order, performed in [facility] hospital lab) *cepheid single result test* Anterior Nasal Swab     Status: None   Collection Time: 05/12/23 11:44 AM   Specimen: Anterior Nasal Swab  Result Value Ref Range   SARS Coronavirus 2 by RT PCR NEGATIVE NEGATIVE    Comment: Performed at [facility], [address] [place], [place] [address]  CBC per protocol     Status: Abnormal   Collection Time: 05/12/23 11:45 AM  Result Value Ref Range   WBC 5.1 4.0 - 10.5 K/uL   RBC 4.12 3.87 - 5.11 MIL/uL   Hemoglobin 11.9 (L) 12.0 - 15.0 g/dL   HCT 75.6 43.3 - 29.5 %   MCV 88.3 80.0 - 100.0 fL   MCH 28.9 26.0 - 34.0 pg   MCHC 32.7 30.0 - 36.0 g/dL   RDW 18.8 41.6 - 60.6 %   Platelets 410 (H) 150 - 400 K/uL   nRBC 0.0 0.0 - 0.2 %    Comment: Performed at [facility], [address], [place], [place] [address]  Pregnancy, urine POC     Status: None   Collection Time: 05/12/23 12:39 PM  Result Value Ref Range   Preg Test, Ur NEGATIVE  NEGATIVE    Comment:        THE SENSITIVITY OF THIS METHODOLOGY IS >24 mIU/mL    No results found.  Review of Systems  All other systems reviewed and are negative.   Blood pressure 125/73, pulse 83, temperature 98.5 F (36.9 C), temperature source Oral, resp. rate 20, height 5\' 5"  (1.651 m), weight 68 kg, last menstrual period 04/20/2023, SpO2 96%. Physical Exam Constitutional:      Appearance: Normal appearance. She is normal weight.  HENT:     Head: Normocephalic and atraumatic.     Right Ear: External ear normal.     Left Ear: External ear normal.     Nose: Nose normal.     Mouth/Throat:     Mouth: Mucous membranes are moist.     Pharynx: Oropharynx is clear.  Eyes:     Extraocular Movements: Extraocular movements intact.     Conjunctiva/sclera: Conjunctivae normal.     Pupils: Pupils are equal, round, and  reactive to light.  Cardiovascular:     Rate and Rhythm: Normal rate.  Pulmonary:     Effort: Pulmonary effort is normal.     Comments: Soft inspiratory stridor with deep inspiration. Musculoskeletal:     Cervical back: Normal range of motion.  Skin:    General: Skin is warm and dry.  Neurological:     General: No focal deficit present.     Mental Status: She is alert and oriented to person, place, and time.  Psychiatric:        Mood and Affect: Mood normal.        Behavior: Behavior normal.        Thought Content: Thought content normal.        Judgment: Judgment normal.      Assessment/Plan Subglottic stenosis  To OR for SMDL with CO2 laser dilation and mitomycin C application.  Christia Reading, MD 05/12/2023, 2:20 PM

## 2023-05-12 NOTE — Op Note (Signed)
PREOPERATIVE DIAGNOSIS:  Subglottic stenosis.   POSTOPERATIVE DIAGNOSIS:  Subglottic stenosis.   PROCEDURE:  Suspended microdirect laryngoscopy with CO2 laser dilation and application of mitomycin C.   SURGEON:  Antony Contras, MD   ANESTHESIA:  General jet Venturi ventilation.   COMPLICATIONS:  None.   INDICATION:  The patient is a 52 year old female who has a history of idiopathic subglottic stenosis.  She has been treated for reflux and has required previous dilation in 2017.  She presents to the operating room for surgical Management due to worsening obstructive symptoms in recent months.   FINDINGS:  There was a 30% circumferential subglottic stenosis.   DESCRIPTION OF PROCEDURE:  The patient was identified in the holding room, informed consent having been obtained with discussion of risks, benefits, and alternatives.  The patient was brought to the operative suite and put on the table in supine position.  Anesthesia was induced, and the patient was maintained via mask ventilation.  The eyes taped closed and bed was turned 90 degrees from anesthesia.  The patient was given intravenous steroids during the case.  A tooth guard was placed over the upper teeth, and a Storz laryngoscope was placed in this in the glottic position and suspended to the Mayo stand using a Lewy arm.  Jet Venturi ventilation was initiated.  Damp eye pads were taped over the eyes and a damp towel was placed over the face.  A preoperative photograph was made with a 0-degree telescope.  The operating microscope with CO2 laser was then brought into the field, and radial incisions were made at 12 o'clock, 3 o'clock, and 9 o'clock using the laser on 8 watts continuous.   Afrin soaked pledgets were used to remove char.  The largest tracheal balloon was then placed into the airway and inflated to 7 atmospheres of pressure for 60 seconds and removed, and then done a second time.  After this, a segment of pledget  sutured around a segment of plastic suction tubing was soaked with mitomycin C, 0.4 mg/ml, 0.5 ml.  The pledget/tubing was placed in the subglottic space for three minutes while ventilating through the tubing.  The contraption was then removed.  The airway was suctioned and the laryngoscope was taken out of suspension and removed from the patient's mouth while suctioning the airway.  She was returned to mask ventilation after removing the tooth guard and was then returned to Anesthesia for wake up.  She was moved to recovery room in stable condition.

## 2023-05-12 NOTE — Anesthesia Preprocedure Evaluation (Signed)
Anesthesia Evaluation  Patient identified by MRN, date of birth, ID band Patient awake    Reviewed: Allergy & Precautions, H&P , NPO status , Patient's Chart, lab work & pertinent test results  Airway Mallampati: II  TM Distance: >3 FB Neck ROM: Full    Dental no notable dental hx.    Pulmonary neg pulmonary ROS Hx of tracheal stenosis   Pulmonary exam normal breath sounds clear to auscultation       Cardiovascular negative cardio ROS Normal cardiovascular exam Rhythm:Regular Rate:Normal     Neuro/Psych  PSYCHIATRIC DISORDERS Anxiety     negative neurological ROS     GI/Hepatic Neg liver ROS,GERD  ,,  Endo/Other  negative endocrine ROS    Renal/GU negative Renal ROS  negative genitourinary   Musculoskeletal negative musculoskeletal ROS (+)    Abdominal   Peds negative pediatric ROS (+)  Hematology  (+) Blood dyscrasia, anemia   Anesthesia Other Findings   Reproductive/Obstetrics negative OB ROS                             Anesthesia Physical Anesthesia Plan  ASA: 3  Anesthesia Plan: General   Post-op Pain Management:    Induction: Intravenous  PONV Risk Score and Plan: TIVA  Airway Management Planned: Oral ETT  Additional Equipment:   Intra-op Plan:   Post-operative Plan: Extubation in OR  Informed Consent: I have reviewed the patients History and Physical, chart, labs and discussed the procedure including the risks, benefits and alternatives for the proposed anesthesia with the patient or authorized representative who has indicated his/her understanding and acceptance.     Dental advisory given  Plan Discussed with: CRNA  Anesthesia Plan Comments:        Anesthesia Quick Evaluation

## 2023-05-12 NOTE — Brief Op Note (Signed)
05/12/2023  3:44 PM  PATIENT:  Denise Barr  52 y.o. female  PRE-OPERATIVE DIAGNOSIS:  Subglottic stenosis  POST-OPERATIVE DIAGNOSIS:  Subglottic stenosis  PROCEDURE:  Procedure(s): SUSPENSION MICRODIRECT LARYNGOSCOPY WITH CO2 LASER DILATION AND MITOMYCIN C APPLICATION; JET VENTILATION (N/A)  SURGEON:  Surgeons and Role:    Christia Reading, MD - Primary  PHYSICIAN ASSISTANT:   ASSISTANTS: none   ANESTHESIA:   general  EBL:  Minimal   BLOOD ADMINISTERED:none  DRAINS: none   LOCAL MEDICATIONS USED:  NONE  SPECIMEN:  No Specimen  DISPOSITION OF SPECIMEN:  N/A  COUNTS:  YES  TOURNIQUET:  * No tourniquets in log *  DICTATION: .Note written in EPIC  PLAN OF CARE: Discharge to home after PACU  PATIENT DISPOSITION:  PACU - hemodynamically stable.   Delay start of Pharmacological VTE agent (>24hrs) due to surgical blood loss or risk of bleeding: no

## 2023-05-12 NOTE — Transfer of Care (Signed)
Immediate Anesthesia Transfer of Care Note  Patient: Denise Barr  Procedure(s) Performed: SUSPENSION MICRODIRECT LARYNGOSCOPY WITH CO2 LASER DILATION AND MITOMYCIN C APPLICATION; JET VENTILATION (Throat)  Patient Location: PACU  Anesthesia Type:General  Level of Consciousness: awake  Airway & Oxygen Therapy: Patient Spontanous Breathing  Post-op Assessment: Report given to RN and Post -op Vital signs reviewed and stable  Post vital signs: Reviewed and stable  Last Vitals:  Vitals Value Taken Time  BP 122/63 (79)   Temp    Pulse 88   Resp 19   SpO2 96     Last Pain:  Vitals:   05/12/23 1212  TempSrc:   PainSc: 0-No pain      Patients Stated Pain Goal: 3 (05/12/23 1212)  Complications: No notable events documented.

## 2023-05-13 ENCOUNTER — Encounter (HOSPITAL_COMMUNITY): Payer: Self-pay | Admitting: Otolaryngology

## 2023-05-13 NOTE — Anesthesia Postprocedure Evaluation (Signed)
Anesthesia Post Note  Patient: Denise Barr  Procedure(s) Performed: SUSPENSION MICRODIRECT LARYNGOSCOPY WITH CO2 LASER DILATION AND MITOMYCIN C APPLICATION; JET VENTILATION (Throat)     Patient location during evaluation: PACU Anesthesia Type: General Level of consciousness: awake and alert Pain management: pain level controlled Vital Signs Assessment: post-procedure vital signs reviewed and stable Respiratory status: spontaneous breathing, nonlabored ventilation, respiratory function stable and patient connected to nasal cannula oxygen Cardiovascular status: blood pressure returned to baseline and stable Postop Assessment: no apparent nausea or vomiting Anesthetic complications: no   No notable events documented.  Last Vitals:  Vitals:   05/12/23 1615 05/12/23 1618  BP: 118/82   Pulse: 78 72  Resp: 15 18  Temp: 36.7 C   SpO2: 97% 96%    Last Pain:  Vitals:   05/12/23 1212  TempSrc:   PainSc: 0-No pain                 [place]

## 2023-05-30 DIAGNOSIS — J301 Allergic rhinitis due to pollen: Secondary | ICD-10-CM | POA: Diagnosis not present

## 2023-05-30 DIAGNOSIS — J3089 Other allergic rhinitis: Secondary | ICD-10-CM | POA: Diagnosis not present

## 2023-05-30 DIAGNOSIS — R0602 Shortness of breath: Secondary | ICD-10-CM | POA: Diagnosis not present

## 2023-05-30 DIAGNOSIS — J3081 Allergic rhinitis due to animal (cat) (dog) hair and dander: Secondary | ICD-10-CM | POA: Diagnosis not present

## 2023-06-25 DIAGNOSIS — D225 Melanocytic nevi of trunk: Secondary | ICD-10-CM | POA: Diagnosis not present

## 2023-06-25 DIAGNOSIS — Z86018 Personal history of other benign neoplasm: Secondary | ICD-10-CM | POA: Diagnosis not present

## 2023-06-25 DIAGNOSIS — L821 Other seborrheic keratosis: Secondary | ICD-10-CM | POA: Diagnosis not present

## 2023-06-25 DIAGNOSIS — L814 Other melanin hyperpigmentation: Secondary | ICD-10-CM | POA: Diagnosis not present

## 2023-06-27 DIAGNOSIS — Z1231 Encounter for screening mammogram for malignant neoplasm of breast: Secondary | ICD-10-CM | POA: Diagnosis not present

## 2023-06-27 DIAGNOSIS — Z780 Asymptomatic menopausal state: Secondary | ICD-10-CM | POA: Diagnosis not present

## 2023-06-27 DIAGNOSIS — Z130 Encounter for screening for diseases of the blood and blood-forming organs and certain disorders involving the immune mechanism: Secondary | ICD-10-CM | POA: Diagnosis not present

## 2023-06-27 DIAGNOSIS — Z01419 Encounter for gynecological examination (general) (routine) without abnormal findings: Secondary | ICD-10-CM | POA: Diagnosis not present

## 2023-07-01 DIAGNOSIS — J3081 Allergic rhinitis due to animal (cat) (dog) hair and dander: Secondary | ICD-10-CM | POA: Diagnosis not present

## 2023-07-01 DIAGNOSIS — J3089 Other allergic rhinitis: Secondary | ICD-10-CM | POA: Diagnosis not present

## 2023-07-01 DIAGNOSIS — J301 Allergic rhinitis due to pollen: Secondary | ICD-10-CM | POA: Diagnosis not present

## 2023-07-11 ENCOUNTER — Telehealth: Payer: Self-pay | Admitting: Plastic Surgery

## 2023-07-11 DIAGNOSIS — J3081 Allergic rhinitis due to animal (cat) (dog) hair and dander: Secondary | ICD-10-CM | POA: Diagnosis not present

## 2023-07-11 DIAGNOSIS — J301 Allergic rhinitis due to pollen: Secondary | ICD-10-CM | POA: Diagnosis not present

## 2023-07-11 DIAGNOSIS — J3089 Other allergic rhinitis: Secondary | ICD-10-CM | POA: Diagnosis not present

## 2023-07-11 NOTE — Telephone Encounter (Signed)
Spoke with pt 07-11-23 and Sat 11-23 was ok for the consult

## 2023-07-16 DIAGNOSIS — J3089 Other allergic rhinitis: Secondary | ICD-10-CM | POA: Diagnosis not present

## 2023-07-16 DIAGNOSIS — J3081 Allergic rhinitis due to animal (cat) (dog) hair and dander: Secondary | ICD-10-CM | POA: Diagnosis not present

## 2023-07-16 DIAGNOSIS — J301 Allergic rhinitis due to pollen: Secondary | ICD-10-CM | POA: Diagnosis not present

## 2023-07-25 ENCOUNTER — Institutional Professional Consult (permissible substitution): Payer: BC Managed Care – PPO | Admitting: Plastic Surgery

## 2023-07-26 ENCOUNTER — Ambulatory Visit: Payer: BC Managed Care – PPO | Admitting: Plastic Surgery

## 2023-07-26 ENCOUNTER — Encounter: Payer: Self-pay | Admitting: Plastic Surgery

## 2023-07-26 VITALS — BP 127/85 | HR 92 | Ht 65.0 in | Wt 150.0 lb

## 2023-07-26 DIAGNOSIS — L989 Disorder of the skin and subcutaneous tissue, unspecified: Secondary | ICD-10-CM | POA: Diagnosis not present

## 2023-07-26 NOTE — Progress Notes (Signed)
Patient ID: Denise Barr, female    DOB: 1971/09/01, 52 y.o.   MRN: [id number]   Chief Complaint  Patient presents with   Consult        Skin Problem    The patient is a 52 year old female here for evaluation of her skin.  She sees a dermatologist regularly.  She has had some atypical and dysplastic lesions in the past.  She is a Fitzpatrick 1.  She is otherwise in good health and does not have any ongoing issues.  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  She has a 9 mm lesion on the left nasal ala.  Is the patient says that it sometimes gets caught and bleeds and seems to be getting larger.  It is flesh-colored.  She also has a 5 mm lesion on the left upper eyelid.  This is also getting larger and sometimes gets irritated.    Review of Systems  Constitutional: Negative.   HENT: Negative.    Eyes: Negative.   Respiratory: Negative.    Cardiovascular: Negative.   Gastrointestinal: Negative.   Genitourinary: Negative.   Musculoskeletal: Negative.   Skin: Negative.     Past Medical History:  Diagnosis Date   Anemia 12/01/2009   with pregnancy only   Anxiety    situational   Dyspnea    with exertion   Eczema    GERD (gastroesophageal reflux disease)    occasional    History of fainting    pt. 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    Past Surgical History:  Procedure Laterality Date   BRAVO PH STUDY N/A 10/22/2013   Procedure: BRAVO PH STUDY;  Surgeon: Theda Belfast, MD;  Location: [facility];  Service: Endoscopy;  Laterality: N/A;   DIRECT LARYNGOSCOPY N/A 01/22/2016   Procedure: DIRECT LARYNGOSCOPY WITH CO2 Laser ;  Surgeon: Christia Reading, MD;  Location: [facility] OR;  Service: ENT;  Laterality: N/A;  Micro Direct Laryngoscopy with Jet Ventilation   EPIDURAL BLOCK INJECTION     x 3 with childbirth   ESOPHAGOGASTRODUODENOSCOPY N/A 10/22/2013   Procedure: ESOPHAGOGASTRODUODENOSCOPY  (EGD);  Surgeon: Theda Belfast, MD;  Location: [facility];  Service: Endoscopy;  Laterality: N/A;   MICROLARYNGOSCOPY WITH CO2 LASER AND EXCISION OF VOCAL CORD LESION N/A 05/12/2023   Procedure: SUSPENSION MICRODIRECT LARYNGOSCOPY WITH CO2 LASER DILATION AND MITOMYCIN C APPLICATION; JET VENTILATION;  Surgeon: Christia Reading, MD;  Location: [facility] OR;  Service: ENT;  Laterality: N/A;   WISDOM TOOTH EXTRACTION  1995      Current Outpatient Medications:    esomeprazole (NEXIUM) 40 MG capsule, Take 40 mg by mouth 2 (two) times daily before a meal., Disp: , Rfl:    fluticasone (FLONASE) 50 MCG/ACT nasal spray, Place 1 spray into both nostrils daily as needed for allergies or rhinitis., Disp: , Rfl:    levocetirizine (XYZAL) 5 MG tablet, Take 5 mg by mouth every evening., Disp: , Rfl: 5   Objective:   Vitals:   07/26/23 0845  BP: 127/85  Pulse: 92  SpO2: 96%    Physical Exam Vitals reviewed.  Constitutional:      Appearance: Normal appearance.  HENT:     Head: Atraumatic.     Nose:   Eyes:   Cardiovascular:     Rate and Rhythm: Normal rate.     Pulses:  Normal pulses.  Musculoskeletal:        General: No swelling.  Skin:    General: Skin is warm.     Capillary Refill: Capillary refill takes less than 2 seconds.     Coloration: Skin is not jaundiced.     Findings: Lesion present. No bruising.  Neurological:     Mental Status: She is alert and oriented to person, place, and time.  Psychiatric:        Mood and Affect: Mood normal.        Behavior: Behavior normal.        Thought Content: Thought content normal.        Judgment: Judgment normal.     Assessment & Plan:  Changing skin lesion  Plan for excision of changing skin lesions left upper eyelid and left nasal ala.  Pictures were obtained of the patient and placed in the chart with the patient's or guardian's permission.   Alena Bills Hensley Treat, DO

## 2023-08-13 DIAGNOSIS — J3089 Other allergic rhinitis: Secondary | ICD-10-CM | POA: Diagnosis not present

## 2023-08-13 DIAGNOSIS — J3081 Allergic rhinitis due to animal (cat) (dog) hair and dander: Secondary | ICD-10-CM | POA: Diagnosis not present

## 2023-08-13 DIAGNOSIS — J301 Allergic rhinitis due to pollen: Secondary | ICD-10-CM | POA: Diagnosis not present

## 2023-09-10 DIAGNOSIS — J3081 Allergic rhinitis due to animal (cat) (dog) hair and dander: Secondary | ICD-10-CM | POA: Diagnosis not present

## 2023-09-10 DIAGNOSIS — J3089 Other allergic rhinitis: Secondary | ICD-10-CM | POA: Diagnosis not present

## 2023-09-10 DIAGNOSIS — J301 Allergic rhinitis due to pollen: Secondary | ICD-10-CM | POA: Diagnosis not present

## 2023-09-17 DIAGNOSIS — J3081 Allergic rhinitis due to animal (cat) (dog) hair and dander: Secondary | ICD-10-CM | POA: Diagnosis not present

## 2023-09-17 DIAGNOSIS — J301 Allergic rhinitis due to pollen: Secondary | ICD-10-CM | POA: Diagnosis not present

## 2023-09-18 DIAGNOSIS — J3089 Other allergic rhinitis: Secondary | ICD-10-CM | POA: Diagnosis not present

## 2023-10-09 DIAGNOSIS — J3089 Other allergic rhinitis: Secondary | ICD-10-CM | POA: Diagnosis not present

## 2023-10-09 DIAGNOSIS — J301 Allergic rhinitis due to pollen: Secondary | ICD-10-CM | POA: Diagnosis not present

## 2023-10-09 DIAGNOSIS — J3081 Allergic rhinitis due to animal (cat) (dog) hair and dander: Secondary | ICD-10-CM | POA: Diagnosis not present

## 2023-10-15 DIAGNOSIS — J3089 Other allergic rhinitis: Secondary | ICD-10-CM | POA: Diagnosis not present

## 2023-10-15 DIAGNOSIS — J3081 Allergic rhinitis due to animal (cat) (dog) hair and dander: Secondary | ICD-10-CM | POA: Diagnosis not present

## 2023-10-15 DIAGNOSIS — J301 Allergic rhinitis due to pollen: Secondary | ICD-10-CM | POA: Diagnosis not present

## 2023-10-21 DIAGNOSIS — J3081 Allergic rhinitis due to animal (cat) (dog) hair and dander: Secondary | ICD-10-CM | POA: Diagnosis not present

## 2023-10-21 DIAGNOSIS — J3089 Other allergic rhinitis: Secondary | ICD-10-CM | POA: Diagnosis not present

## 2023-10-21 DIAGNOSIS — J301 Allergic rhinitis due to pollen: Secondary | ICD-10-CM | POA: Diagnosis not present

## 2023-10-24 ENCOUNTER — Ambulatory Visit: Payer: BC Managed Care – PPO | Admitting: Plastic Surgery

## 2023-10-27 DIAGNOSIS — J3081 Allergic rhinitis due to animal (cat) (dog) hair and dander: Secondary | ICD-10-CM | POA: Diagnosis not present

## 2023-10-27 DIAGNOSIS — J3089 Other allergic rhinitis: Secondary | ICD-10-CM | POA: Diagnosis not present

## 2023-10-27 DIAGNOSIS — J301 Allergic rhinitis due to pollen: Secondary | ICD-10-CM | POA: Diagnosis not present

## 2023-11-03 DIAGNOSIS — J3081 Allergic rhinitis due to animal (cat) (dog) hair and dander: Secondary | ICD-10-CM | POA: Diagnosis not present

## 2023-11-03 DIAGNOSIS — J3089 Other allergic rhinitis: Secondary | ICD-10-CM | POA: Diagnosis not present

## 2023-11-03 DIAGNOSIS — J301 Allergic rhinitis due to pollen: Secondary | ICD-10-CM | POA: Diagnosis not present

## 2023-11-07 ENCOUNTER — Ambulatory Visit: Payer: BC Managed Care – PPO | Admitting: Plastic Surgery

## 2023-11-07 ENCOUNTER — Encounter: Payer: Self-pay | Admitting: Plastic Surgery

## 2023-11-07 VITALS — BP 148/81 | HR 91

## 2023-11-07 DIAGNOSIS — L989 Disorder of the skin and subcutaneous tissue, unspecified: Secondary | ICD-10-CM | POA: Diagnosis not present

## 2023-11-07 DIAGNOSIS — D2239 Melanocytic nevi of other parts of face: Secondary | ICD-10-CM

## 2023-11-07 NOTE — Progress Notes (Signed)
 Procedure Note  Preoperative Dx: changing skin lesion Left Nose Left upper eyelid  Postoperative Dx: Same  Procedure:  Excision of changing skin lesion left nose 8 mm Excision of changing skin lesion left upper eye lid 3 mm  Anesthesia: Lidocaine 1% with 1:100,000 epinephrine  Indication for Procedure: skin lesion  Description of Procedure: Risks and complications were explained to the patient.  Consent was confirmed and the patient understands the risks and benefits.  The potential complications and alternatives were explained and the patient consents.  The patient expressed understanding the option of not having the procedure and the risks of a scar.  Time out was called and all information was confirmed to be correct.    Nose: The area was prepped and draped.  Lidocaine 1% with epinephrine was injected in the subcutaneous area.  After waiting several minutes for the local to take affect a #15 blade was used to excise the area in an eliptical pattern.  The skin edges were reapproximated with 5-0 Monocryl subcuticular running closure.  A dressing was applied.   Eye lid:  The area was prepped and draped.  Lidocaine 1% with epinephrine was injected in the subcutaneous area.  After waiting for several minutes the #15 blade was used to excise the lesion.  The 5-0 Chromic was used to close the skin.  The patient was given instructions on how to care for the area and a follow up appointment.  Dijon tolerated the procedure well and there were no complications. The nose specimen was sent to pathology.

## 2023-11-08 ENCOUNTER — Other Ambulatory Visit (HOSPITAL_COMMUNITY)
Admission: RE | Admit: 2023-11-08 | Discharge: 2023-11-08 | Disposition: A | Discharge status: Home / Self Care | Source: Ambulatory Visit | Attending: Plastic Surgery | Admitting: Plastic Surgery

## 2023-11-08 DIAGNOSIS — L989 Disorder of the skin and subcutaneous tissue, unspecified: Secondary | ICD-10-CM | POA: Insufficient documentation

## 2023-11-08 NOTE — Addendum Note (Signed)
 Addended by: Peggye Form on: 11/08/2023 08:29 AM   Modules accepted: Orders

## 2023-11-10 ENCOUNTER — Telehealth: Payer: Self-pay

## 2023-11-10 NOTE — Progress Notes (Signed)
 Patient is a 53 year old female who recently underwent excision of changing skin lesion to the left nose and excision of changing skin lesion to the left upper eyelid with Dr. Ulice Bold on 11/07/2023.  She is 10 days out from her procedure.  She presents to the clinic today for postprocedural follow-up.  Nose specimen was sent to pathology.  Pathology shows that the lesion to the left nose was consistent with an intradermal nevus, and the nevus extends to the deep soft tissue margin.  There were no atypical features.  Today, patient reports she is doing well.  She denies any issues with her procedure sites.  She denies any fevers or chills.  She denies any drainage from either of her procedure sites.  We did discuss the results of her pathology.  Discussed with her that the nevus extended into the deep soft tissue margin.  We did discuss risk of recurrence and going forward from here with watchful waiting versus possible further excision.  Patient decided to continue to closely monitor the area and if she finds that it is coming back, she will reach out.  On exam, patient is sitting upright in no acute distress.  To the nose, incision was intact with Steri-Strips.  Steri-Strip was removed.  Incision was intact with Monocryl suture.  This was cut and removed.  Patient tolerated well.  There is no drainage or signs of infection to the nose.  To the left upper eyelid, incision is intact with chromic sutures.  Chromic suture was removed without any difficulty.  Patient tolerated well.  There is no swelling, redness or drainage.  No signs of infection.  Recommended that patient apply Vaseline to her incisions for the next week or so.  Recommended that she then transition to scar creams, recommended silicone based scar creams.  Also recommended that patient avoid direct sunlight as this can worsen the scar.  Patient expressed understanding.  Patient to follow-up as needed.  I instructed her to call if she has  any questions or concerns about anything.

## 2023-11-10 NOTE — Telephone Encounter (Signed)
 Called the patient to check in after her in-office procedure on 11/07/23. Patient is doing well. She had some questions about her steri-strips. I answered her questions and patient expressed understanding. I told her if she had any concerns or questions to call or send a MyChart message.

## 2023-11-12 LAB — SURGICAL PATHOLOGY

## 2023-11-17 ENCOUNTER — Ambulatory Visit: Admitting: Student

## 2023-11-17 VITALS — BP 111/53 | HR 88

## 2023-11-17 DIAGNOSIS — L989 Disorder of the skin and subcutaneous tissue, unspecified: Secondary | ICD-10-CM

## 2023-12-01 DIAGNOSIS — J3089 Other allergic rhinitis: Secondary | ICD-10-CM | POA: Diagnosis not present

## 2023-12-01 DIAGNOSIS — J3081 Allergic rhinitis due to animal (cat) (dog) hair and dander: Secondary | ICD-10-CM | POA: Diagnosis not present

## 2023-12-01 DIAGNOSIS — J301 Allergic rhinitis due to pollen: Secondary | ICD-10-CM | POA: Diagnosis not present

## 2023-12-24 DIAGNOSIS — J3081 Allergic rhinitis due to animal (cat) (dog) hair and dander: Secondary | ICD-10-CM | POA: Diagnosis not present

## 2023-12-24 DIAGNOSIS — J3089 Other allergic rhinitis: Secondary | ICD-10-CM | POA: Diagnosis not present

## 2023-12-24 DIAGNOSIS — J301 Allergic rhinitis due to pollen: Secondary | ICD-10-CM | POA: Diagnosis not present

## 2024-01-20 DIAGNOSIS — J3081 Allergic rhinitis due to animal (cat) (dog) hair and dander: Secondary | ICD-10-CM | POA: Diagnosis not present

## 2024-01-20 DIAGNOSIS — J3089 Other allergic rhinitis: Secondary | ICD-10-CM | POA: Diagnosis not present

## 2024-01-20 DIAGNOSIS — J301 Allergic rhinitis due to pollen: Secondary | ICD-10-CM | POA: Diagnosis not present

## 2024-02-18 DIAGNOSIS — J3089 Other allergic rhinitis: Secondary | ICD-10-CM | POA: Diagnosis not present

## 2024-02-18 DIAGNOSIS — J3081 Allergic rhinitis due to animal (cat) (dog) hair and dander: Secondary | ICD-10-CM | POA: Diagnosis not present

## 2024-02-18 DIAGNOSIS — J301 Allergic rhinitis due to pollen: Secondary | ICD-10-CM | POA: Diagnosis not present

## 2024-02-24 DIAGNOSIS — J301 Allergic rhinitis due to pollen: Secondary | ICD-10-CM | POA: Diagnosis not present

## 2024-02-24 DIAGNOSIS — J3089 Other allergic rhinitis: Secondary | ICD-10-CM | POA: Diagnosis not present

## 2024-02-24 DIAGNOSIS — J3081 Allergic rhinitis due to animal (cat) (dog) hair and dander: Secondary | ICD-10-CM | POA: Diagnosis not present

## 2024-02-27 ENCOUNTER — Encounter

## 2024-03-19 ENCOUNTER — Encounter: Admitting: Gastroenterology

## 2024-03-23 DIAGNOSIS — J3081 Allergic rhinitis due to animal (cat) (dog) hair and dander: Secondary | ICD-10-CM | POA: Diagnosis not present

## 2024-03-23 DIAGNOSIS — J3089 Other allergic rhinitis: Secondary | ICD-10-CM | POA: Diagnosis not present

## 2024-03-23 DIAGNOSIS — J301 Allergic rhinitis due to pollen: Secondary | ICD-10-CM | POA: Diagnosis not present

## 2024-03-25 DIAGNOSIS — J3081 Allergic rhinitis due to animal (cat) (dog) hair and dander: Secondary | ICD-10-CM | POA: Diagnosis not present

## 2024-03-25 DIAGNOSIS — J3089 Other allergic rhinitis: Secondary | ICD-10-CM | POA: Diagnosis not present

## 2024-03-25 DIAGNOSIS — J301 Allergic rhinitis due to pollen: Secondary | ICD-10-CM | POA: Diagnosis not present

## 2024-04-21 ENCOUNTER — Ambulatory Visit (INDEPENDENT_AMBULATORY_CARE_PROVIDER_SITE_OTHER): Admitting: Gastroenterology

## 2024-04-21 ENCOUNTER — Encounter: Payer: Self-pay | Admitting: Gastroenterology

## 2024-04-21 VITALS — BP 110/70 | HR 81 | Ht 65.0 in | Wt 161.0 lb

## 2024-04-21 DIAGNOSIS — Z1211 Encounter for screening for malignant neoplasm of colon: Secondary | ICD-10-CM

## 2024-04-21 DIAGNOSIS — K219 Gastro-esophageal reflux disease without esophagitis: Secondary | ICD-10-CM

## 2024-04-21 MED ORDER — NA SULFATE-K SULFATE-MG SULF 17.5-3.13-1.6 GM/177ML PO SOLN: 1.0000 | Freq: Once | ORAL | 0 refills | Status: AC

## 2024-04-21 NOTE — Progress Notes (Signed)
 Denise Barr    [id number]    04/02/1971  Primary Care Physician:Wharton, Charmaine RIGGERS  Referring Physician: Katina Charmaine, PA-C [address] [place],  [place] [address]   Chief complaint:  Colon cancer screening, GERD  Discussed the use of AI scribe software for clinical note transcription with the patient, who gave verbal consent to proceed.  History of Present Illness Denise Barr is a 53 year old female with a history of tracheal scar tissue who presents with respiratory and gastrointestinal symptoms. She was referred by Lauraine Monday for evaluation of her respiratory and gastrointestinal symptoms.  Tracheal stenosis and respiratory symptoms - Diagnosed with tracheal scar tissue approximately ten years ago during evaluation for exercise-induced asthma, presenting as difficulty breathing while running half marathons - A tracheal mass was identified and determined to be scar tissue causing tracheal narrowing - Underwent a procedure in 2017 to remove the scar tissue, resulting in significant improvement in breathing - Recurrence of symptoms last fall, requiring a repeat procedure to remove the scar tissue - Current symptoms include postnasal drip and coughing, attributed to allergies - Significant seasonal allergies contribute to respiratory symptoms - Maintains an active lifestyle, including playing tennis, despite respiratory challenges  Gastrointestinal symptoms and reflux management - Takes over-the-counter Nexium 40 mg once daily in the morning due to lack of insurance coverage for prescription version - No typical reflux symptoms - Uncertain about the effectiveness of Nexium - No changes in bowel habits - No blood in stool - No current gastrointestinal pain      Outpatient Encounter Medications as of 04/21/2024  Medication Sig   esomeprazole (NEXIUM) 20 MG packet Take 40 mg by mouth daily before breakfast.   fluticasone (FLONASE) 50 MCG/ACT nasal  spray Place 1 spray into both nostrils daily as needed for allergies or rhinitis.   levocetirizine (XYZAL) 5 MG tablet Take 5 mg by mouth every evening.   [DISCONTINUED] esomeprazole (NEXIUM) 40 MG capsule Take 40 mg by mouth 2 (two) times daily before a meal.   No facility-administered encounter medications on file as of 04/21/2024.    Allergies as of 04/21/2024   (No Known Allergies)    Past Medical History:  Diagnosis Date   Anemia 12/01/2009   with pregnancy only   Anxiety    situational   Dyspnea    with exertion   Eczema    GERD (gastroesophageal reflux disease)    occasional    History of fainting    pt. 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    Past Surgical History:  Procedure Laterality Date   BRAVO PH STUDY N/A 10/22/2013   Procedure: BRAVO PH STUDY;  Surgeon: Belvie JONETTA Just, MD;  Location: [facility];  Service: Endoscopy;  Laterality: N/A;   DIRECT LARYNGOSCOPY N/A 01/22/2016   Procedure: DIRECT LARYNGOSCOPY WITH CO2 Laser ;  Surgeon: Vaughan Ricker, MD;  Location: [facility] OR;  Service: ENT;  Laterality: N/A;  Micro Direct Laryngoscopy with Jet Ventilation   EPIDURAL BLOCK INJECTION     x 3 with childbirth   ESOPHAGOGASTRODUODENOSCOPY N/A 10/22/2013   Procedure: ESOPHAGOGASTRODUODENOSCOPY (EGD);  Surgeon: Belvie JONETTA Just, MD;  Location: [facility];  Service: Endoscopy;  Laterality: N/A;   MICROLARYNGOSCOPY WITH CO2 LASER AND EXCISION OF VOCAL CORD LESION N/A 05/12/2023   Procedure: SUSPENSION MICRODIRECT LARYNGOSCOPY  WITH CO2 LASER DILATION AND MITOMYCIN  C APPLICATION; JET VENTILATION;  Surgeon: Carlie Clark, MD;  Location: [facility] OR;  Service: ENT;  Laterality: N/A;   WISDOM TOOTH EXTRACTION  1995    Family History  Problem Relation Age of Onset   Thyroid  cancer Mother    Barrett's esophagus Father    GER disease Father    Thyroid  cancer Sister    Esophageal cancer Paternal Grandfather     Colon cancer Neg Hx     Social History   Socioeconomic History   Marital status: Married    Spouse name: Not on file   Number of children: 3   Years of education: Not on file   Highest education level: Not on file  Occupational History   Not on file  Tobacco Use   Smoking status: Never   Smokeless tobacco: Never  Vaping Use   Vaping status: Never Used  Substance and Sexual Activity   Alcohol use: Yes    Comment: 2 glasses of wine a month maybe-   Drug use: No   Sexual activity: Not on file  Other Topics Concern   Not on file  Social History Narrative   Not on file   Social Drivers of Health   Financial Resource Strain: Not on file  Food Insecurity: Not on file  Transportation Needs: Not on file  Physical Activity: Not on file  Stress: Not on file  Social Connections: Not on file  Intimate Partner Violence: Not on file      Review of systems: All other review of systems negative except as mentioned in the HPI.   Physical Exam: Vitals:   04/21/24 1407  BP: 110/70  Pulse: 81   Body mass index is 26.79 kg/m. Gen:      No acute distress HEENT:  sclera anicteric CV: s1s2 rrr, no murmur Lungs: B/l clear. Abd:      soft, non-tender; no palpable masses, no distension Ext:    No edema Neuro: alert and oriented x 3 Psych: normal mood and affect  Data Reviewed:  Reviewed labs, radiology imaging, old records and pertinent past GI work up     Assessment and Plan Assessment & Plan Gastroesophageal reflux disease without esophagitis Silent reflux with no current symptoms. Previous evaluations showed no esophageal damage, and a Bravo pH study conducted ten years ago was within normal limits. Currently on Nexium 40 mg once daily, but unsure of its benefit due to lack of symptoms. Family history of acid reflux and Barrett's esophagus. Discussed potential risks of long-term PPI use, including deficiencies in B12, iron, vitamin D, and magnesium. - Perform upper  endoscopy to assess for esophageal damage or structural abnormalities. - Consider Bravo pH study if upper endoscopy shows no signs of damage. - Discuss potential surgical and endoscopic options if acid reflux is confirmed. - Provide information on TIF procedure for reflux management.  Screening for colorectal cancer Due for colorectal cancer screening. No changes in bowel habits or blood in stool. - Schedule colonoscopy for colorectal cancer screening, to be performed concurrently with upper endoscopy to minimize anesthesia exposure. Discussed risks of the procedure, including infection, bleeding, and perforation, with a risk of less than 1 in 1000.  The risks and benefits as well as alternatives of endoscopic procedure(s) have been discussed and reviewed. All questions answered. The patient agrees to proceed.    The patient was provided an opportunity to ask questions and all were answered. The patient agreed with the plan and demonstrated  an understanding of the instructions.  Denise Barr , MD    CC: Katina Pfeiffer, PA-C

## 2024-04-21 NOTE — Patient Instructions (Addendum)
 VISIT SUMMARY:  During your visit, we discussed your respiratory and gastrointestinal symptoms, including your history of tracheal scar tissue and current management of silent reflux. We also addressed the need for colorectal cancer screening.  YOUR PLAN:  GASTROESOPHAGEAL REFLUX DISEASE WITHOUT ESOPHAGITIS: You have silent reflux with no current symptoms and a family history of acid reflux and Barrett's esophagus. You are currently taking Nexium 40 mg once daily. -We will perform an upper endoscopy to check for any esophageal damage or structural abnormalities. -If the upper endoscopy shows no signs of damage, we may consider a Bravo pH study. -We discussed potential surgical and endoscopic options if acid reflux is confirmed. -You were provided with information on the TIF procedure for managing reflux.  SCREENING FOR COLORECTAL CANCER: You are due for colorectal cancer screening. There have been no changes in your bowel habits or blood in your stool. -We will schedule a colonoscopy for colorectal cancer screening, which will be done at the same time as your upper endoscopy to minimize anesthesia exposure. -We discussed the risks of the procedure, including infection, bleeding, and perforation, with a risk of less than 1 in 1000.  You have been scheduled for an endoscopy and colonoscopy. Please follow the written instructions given to you at your visit today.  If you use inhalers (even only as needed), please bring them with you on the day of your procedure.  DO NOT TAKE 7 DAYS PRIOR TO TEST- Trulicity (dulaglutide) Ozempic, Wegovy (semaglutide) Mounjaro (tirzepatide) Bydureon Bcise (exanatide extended release)  DO NOT TAKE 1 DAY PRIOR TO YOUR TEST Rybelsus (semaglutide) Adlyxin (lixisenatide) Victoza (liraglutide) Byetta (exanatide) ___________________________________________________________________________   Due to recent changes in healthcare laws, you may see the results of  your imaging and laboratory studies on MyChart before your provider has had a chance to review them.  We understand that in some cases there may be results that are confusing or concerning to you. Not all laboratory results come back in the same time frame and the provider may be waiting for multiple results in order to interpret others.  Please give us  48 hours in order for your provider to thoroughly review all the results before contacting the office for clarification of your results.    I appreciate the  opportunity to care for you  Thank You   Kavitha Nandigam , MD

## 2024-04-22 DIAGNOSIS — J301 Allergic rhinitis due to pollen: Secondary | ICD-10-CM | POA: Diagnosis not present

## 2024-04-22 DIAGNOSIS — J3081 Allergic rhinitis due to animal (cat) (dog) hair and dander: Secondary | ICD-10-CM | POA: Diagnosis not present

## 2024-04-22 DIAGNOSIS — J3089 Other allergic rhinitis: Secondary | ICD-10-CM | POA: Diagnosis not present

## 2024-05-19 DIAGNOSIS — J301 Allergic rhinitis due to pollen: Secondary | ICD-10-CM | POA: Diagnosis not present

## 2024-05-19 DIAGNOSIS — J3089 Other allergic rhinitis: Secondary | ICD-10-CM | POA: Diagnosis not present

## 2024-05-19 DIAGNOSIS — J3081 Allergic rhinitis due to animal (cat) (dog) hair and dander: Secondary | ICD-10-CM | POA: Diagnosis not present

## 2024-05-28 DIAGNOSIS — J398 Other specified diseases of upper respiratory tract: Secondary | ICD-10-CM | POA: Diagnosis not present

## 2024-05-28 DIAGNOSIS — J3081 Allergic rhinitis due to animal (cat) (dog) hair and dander: Secondary | ICD-10-CM | POA: Diagnosis not present

## 2024-05-28 DIAGNOSIS — J4520 Mild intermittent asthma, uncomplicated: Secondary | ICD-10-CM | POA: Diagnosis not present

## 2024-05-28 DIAGNOSIS — K219 Gastro-esophageal reflux disease without esophagitis: Secondary | ICD-10-CM | POA: Diagnosis not present

## 2024-06-04 ENCOUNTER — Encounter: Payer: Self-pay | Admitting: Gastroenterology

## 2024-06-11 ENCOUNTER — Ambulatory Visit: Admitting: Gastroenterology

## 2024-06-11 ENCOUNTER — Encounter: Payer: Self-pay | Admitting: Gastroenterology

## 2024-06-11 VITALS — BP 110/66 | HR 82 | Temp 98.2°F | Ht 65.0 in | Wt 161.0 lb

## 2024-06-11 MED ORDER — SODIUM CHLORIDE 0.9 % IV SOLN: 500.0000 mL | INTRAVENOUS | Status: AC

## 2024-06-11 NOTE — Progress Notes (Signed)
 Pt arrived for procedures and it was discovered that she was not instructed to hold her PPI. Due to not holding PPI, MD did not decided that it would not be beneficial to place the Bravo Capsule on today's EGD exam. After much discussion,  pt decided to reschedule both the Bravo and the colonoscopy. Rescheduled for 07/23/24 at 1:30pm. Instructions given for Bravo and colonoscopy. Pt verbalized understanding of instructions on holding Nexium 7 days prior to exam. Pt given sample of Sutab.

## 2024-06-11 NOTE — Progress Notes (Signed)
 [facility] History and Physical   Primary Care Physician:  Katina Pfeiffer, PA-C   Reason for Procedure:  Colorectal cancer screening  Plan:    Screening colonoscopy with possible interventions as needed     HPI: Denise Barr is a very pleasant 53 y.o. female here for screening colonoscopy. Denies any nausea, vomiting, abdominal pain, melena or bright red blood per rectum  The risks and benefits as well as alternatives of endoscopic procedure(s) have been discussed and reviewed. All questions answered. The patient agrees to proceed.    Past Medical History:  Diagnosis Date   Anemia 12/01/2009   with pregnancy only   Anxiety    situational   Dyspnea    with exertion   Eczema    GERD (gastroesophageal reflux disease)    occasional    History of fainting    pt. 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    Past Surgical History:  Procedure Laterality Date   BRAVO PH STUDY N/A 10/22/2013   Procedure: BRAVO PH STUDY;  Surgeon: Belvie JONETTA Just, MD;  Location: [facility];  Service: Endoscopy;  Laterality: N/A;   DIRECT LARYNGOSCOPY N/A 01/22/2016   Procedure: DIRECT LARYNGOSCOPY WITH CO2 Laser ;  Surgeon: Vaughan Ricker, MD;  Location: [facility] OR;  Service: ENT;  Laterality: N/A;  Micro Direct Laryngoscopy with Jet Ventilation   EPIDURAL BLOCK INJECTION     x 3 with childbirth   ESOPHAGOGASTRODUODENOSCOPY N/A 10/22/2013   Procedure: ESOPHAGOGASTRODUODENOSCOPY (EGD);  Surgeon: Belvie JONETTA Just, MD;  Location: [facility];  Service: Endoscopy;  Laterality: N/A;   MICROLARYNGOSCOPY WITH CO2 LASER AND EXCISION OF VOCAL CORD LESION N/A 05/12/2023   Procedure: SUSPENSION MICRODIRECT LARYNGOSCOPY WITH CO2 LASER DILATION AND MITOMYCIN  C APPLICATION; JET VENTILATION;  Surgeon: Ricker Vaughan, MD;  Location: [facility] OR;  Service: ENT;  Laterality: N/A;   WISDOM TOOTH EXTRACTION  1995    Prior to  Admission medications   Medication Sig Start Date End Date Taking? Authorizing Provider  esomeprazole (NEXIUM) 20 MG packet Take 40 mg by mouth daily before breakfast.   Yes [provider]  fluticasone (FLONASE) 50 MCG/ACT nasal spray Place 1 spray into both nostrils daily as needed for allergies or rhinitis.   Yes [provider]  levocetirizine (XYZAL) 5 MG tablet Take 5 mg by mouth every evening. 12/14/15  Yes [provider]    Current Outpatient Medications  Medication Sig Dispense Refill   esomeprazole (NEXIUM) 20 MG packet Take 40 mg by mouth daily before breakfast.     fluticasone (FLONASE) 50 MCG/ACT nasal spray Place 1 spray into both nostrils daily as needed for allergies or rhinitis.     levocetirizine (XYZAL) 5 MG tablet Take 5 mg by mouth every evening.  5   Current Facility-Administered Medications  Medication Dose Route Frequency Provider Last Rate Last Admin   0.9 %  sodium chloride  infusion  500 mL Intravenous Continuous Lashia Niese V, MD        Allergies as of 06/11/2024   (No Known Allergies)    Family History  Problem Relation Age of Onset   Thyroid  cancer Mother    Barrett's esophagus Father    GER disease Father    Thyroid  cancer Sister    Esophageal cancer Paternal Grandfather    Colon cancer Neg Hx     Social History   Socioeconomic  History   Marital status: Married    Spouse name: Not on file   Number of children: 3   Years of education: Not on file   Highest education level: Not on file  Occupational History   Not on file  Tobacco Use   Smoking status: Never   Smokeless tobacco: Never  Vaping Use   Vaping status: Never Used  Substance and Sexual Activity   Alcohol use: Yes    Comment: 2 glasses of wine a month maybe-   Drug use: No   Sexual activity: Not on file  Other Topics Concern   Not on file  Social History Narrative   Not on file   Social Drivers of Health   Financial Resource Strain: Not on  file  Food Insecurity: Not on file  Transportation Needs: Not on file  Physical Activity: Not on file  Stress: Not on file  Social Connections: Not on file  Intimate Partner Violence: Not on file    Review of Systems:  All other review of systems negative except as mentioned in the HPI.  Physical Exam: Vital signs in last 24 hours: BP 110/66   Pulse 82   Temp 98.2 F (36.8 C)   Ht 5' 5 (1.651 m)   Wt 161 lb (73 kg)   SpO2 100%   BMI 26.79 kg/m  General:   Alert, NAD Lungs:  Clear .   Heart:  Regular rate and rhythm Abdomen:  Soft, nontender and nondistended. Neuro/Psych:  Alert and cooperative. Normal mood and affect. A and O x 3  Reviewed labs, radiology imaging, old records and pertinent past GI work up  Patient is appropriate for planned procedure(s) and anesthesia in an ambulatory setting   K. Veena Cristin Penaflor , MD [phone]

## 2024-06-14 ENCOUNTER — Encounter

## 2024-06-15 ENCOUNTER — Encounter

## 2024-06-17 DIAGNOSIS — J3081 Allergic rhinitis due to animal (cat) (dog) hair and dander: Secondary | ICD-10-CM | POA: Diagnosis not present

## 2024-06-17 DIAGNOSIS — J3089 Other allergic rhinitis: Secondary | ICD-10-CM | POA: Diagnosis not present

## 2024-06-17 DIAGNOSIS — J301 Allergic rhinitis due to pollen: Secondary | ICD-10-CM | POA: Diagnosis not present

## 2024-07-19 DIAGNOSIS — J301 Allergic rhinitis due to pollen: Secondary | ICD-10-CM | POA: Diagnosis not present

## 2024-07-19 DIAGNOSIS — J3089 Other allergic rhinitis: Secondary | ICD-10-CM | POA: Diagnosis not present

## 2024-07-19 DIAGNOSIS — J3081 Allergic rhinitis due to animal (cat) (dog) hair and dander: Secondary | ICD-10-CM | POA: Diagnosis not present

## 2024-07-20 DIAGNOSIS — D225 Melanocytic nevi of trunk: Secondary | ICD-10-CM | POA: Diagnosis not present

## 2024-07-20 DIAGNOSIS — L814 Other melanin hyperpigmentation: Secondary | ICD-10-CM | POA: Diagnosis not present

## 2024-07-20 DIAGNOSIS — D224 Melanocytic nevi of scalp and neck: Secondary | ICD-10-CM | POA: Diagnosis not present

## 2024-07-20 DIAGNOSIS — Z86018 Personal history of other benign neoplasm: Secondary | ICD-10-CM | POA: Diagnosis not present

## 2024-07-20 DIAGNOSIS — L821 Other seborrheic keratosis: Secondary | ICD-10-CM | POA: Diagnosis not present

## 2024-07-20 DIAGNOSIS — D485 Neoplasm of uncertain behavior of skin: Secondary | ICD-10-CM | POA: Diagnosis not present

## 2024-07-23 ENCOUNTER — Ambulatory Visit: Admitting: Gastroenterology

## 2024-07-23 ENCOUNTER — Encounter: Payer: Self-pay | Admitting: Gastroenterology

## 2024-07-23 VITALS — BP 122/78 | HR 73 | Temp 98.2°F | Resp 18 | Ht 65.0 in | Wt 161.0 lb

## 2024-07-23 DIAGNOSIS — K219 Gastro-esophageal reflux disease without esophagitis: Secondary | ICD-10-CM

## 2024-07-23 DIAGNOSIS — Z1211 Encounter for screening for malignant neoplasm of colon: Secondary | ICD-10-CM

## 2024-07-23 DIAGNOSIS — K2970 Gastritis, unspecified, without bleeding: Secondary | ICD-10-CM

## 2024-07-23 MED ORDER — SODIUM CHLORIDE 0.9 % IV SOLN: 500.0000 mL | Freq: Once | INTRAVENOUS | Status: DC

## 2024-07-23 NOTE — Progress Notes (Signed)
 Called to room to assist during endoscopic procedure.  Patient ID and intended procedure confirmed with present staff. Received instructions for my participation in the procedure from the performing physician.

## 2024-07-23 NOTE — Patient Instructions (Addendum)
 Continue present medications. Repeat colonoscopy in 10 years for surveillance  Await pathology   Post-op Bravo pH instructions Once you get home:  Eat normally and go about your daily routine/activities Limit drinking fluids or eating between meals Do not chew gum or eat hard candy DO NOT take any antacid or anti-reflux medications during the 48-hour monitoring time, unless instructed by your physician  Recording events: Events to be recorded are:  Record using event buttons on recorder and write on paper diary form Every time you eat or drink something (other than water) 2.   Periods of lying down/reclining 3.  Symptoms:  may include heartburn, regurgitation, chest pain, cough or specify if other.  A paper diary is also provided to record the times of your reflux symptoms and times for meals and when you lie down.  The recorder needs to remain within 3 feet (arms length) of you during the testing period (48 hours). If you should forget and move outside of a 3-foot radius of the receiver you may hear beeping and you will see a "C1" error in the display window on the top of the receiver.  Please pick up the receiver and hold close to you to re-establish the connection and the error message disappears.  You may take a bath/shower during the testing period, but the recorder must not get wet and must remain within 3 feet of you. Please leave the receiver outside of the shower or tub while bathing. The monitoring period will be for 48 hours after placement of the capsule.  At the end of the 48 hours, you will return the recorder, and your diary, to our [facility] front desk.  A nurse will meet you to collect the device and answer any questions you may have.  The device should turn off once the 48 hours is complete.   What to expect after placement of the capsule:  Some patients experience a vague sensation that something is in their esophagus or that they 'feel' the capsule  when they swallow food.  Should you experience this, chewing food carefully or drinking liquids may minimize this sensation.   After the test is complete, the disposable capsule will fall off the wall of your esophagus within 5-10 days and pass naturally with your bowel movement through the digestive tract.  Once the recorder is returned, your provider will review and interpret your recordings and contact you to discuss your results.  This may take up to two weeks.   DO NOT have an MRI for 30 days after your procedure to ensure the capsule is no longer inside your body  It is imperative that you return the recorder on _________________________ by 3:00pm.  Your information must be downloaded at this time to obtain your results.          YOU HAD AN ENDOSCOPIC PROCEDURE TODAY AT THE [facility]:   Refer to the procedure report that was given to you for any specific questions about what was found during the examination.  If the procedure report does not answer your questions, please call your gastroenterologist to clarify.  If you requested that your care partner not be given the details of your procedure findings, then the procedure report has been included in a sealed envelope for you to review at your convenience later.  YOU SHOULD EXPECT: Some feelings of bloating in the abdomen. Passage of more gas than usual.  Walking can help get rid of the air that  was put into your GI tract during the procedure and reduce the bloating. If you had a lower endoscopy (such as a colonoscopy or flexible sigmoidoscopy) you may notice spotting of blood in your stool or on the toilet paper. If you underwent a bowel prep for your procedure, you may not have a normal bowel movement for a few days.  Please Note:  You might notice some irritation and congestion in your nose or some drainage.  This is from the oxygen used during your procedure.  There is no need for concern and it should clear up in a day or  so.  SYMPTOMS TO REPORT IMMEDIATELY:  Following lower endoscopy (colonoscopy or flexible sigmoidoscopy):  Excessive amounts of blood in the stool  Significant tenderness or worsening of abdominal pains  Swelling of the abdomen that is new, acute  Fever of 100F or higher  Following upper endoscopy (EGD)  Vomiting of blood or coffee ground material  New chest pain or pain under the shoulder blades  Painful or persistently difficult swallowing  New shortness of breath  Fever of 100F or higher  Black, tarry-looking stools  For urgent or emergent issues, a gastroenterologist can be reached at any hour by calling [phone]. Do not use MyChart messaging for urgent concerns.    DIET:  We do recommend a small meal at first, but then you may proceed to your regular diet.  Drink plenty of fluids but you should avoid alcoholic beverages for 24 hours.  ACTIVITY:  You should plan to take it easy for the rest of today and you should NOT DRIVE or use heavy machinery until tomorrow (because of the sedation medicines used during the test).    FOLLOW UP: Our staff will call the number listed on your records the next business day following your procedure.  We will call around 7:15- 8:00 am to check on you and address any questions or concerns that you may have regarding the information given to you following your procedure. If we do not reach you, we will leave a message.     If any biopsies were taken you will be contacted by phone or by letter within the next 1-3 weeks.  Please call us  at [phone] if you have not heard about the biopsies in 3 weeks.    SIGNATURES/CONFIDENTIALITY: You and/or your care partner have signed paperwork which will be entered into your electronic medical record.  These signatures attest to the fact that that the information above on your After Visit Summary has been reviewed and is understood.  Full responsibility of the confidentiality of this discharge  information lies with you and/or your care-partner.

## 2024-07-23 NOTE — Op Note (Signed)
 [facility] Patient Name: Denise Barr Procedure Date: 07/23/2024 2:41 PM MRN: [id number] Endoscopist: Gustav ALONSO Mcgee , MD, [id number] Age: 53 Referring MD:  Date of Birth: 1970-10-25 Gender: Female Account #: [id number] Procedure:                Colonoscopy Indications:              Screening for colorectal malignant neoplasm Medicines:                Monitored Anesthesia Care Procedure:                Pre-Anesthesia Assessment:                           - Prior to the procedure, a History and Physical                            was performed, and patient medications and                            allergies were reviewed. The patient's tolerance of                            previous anesthesia was also reviewed. The risks                            and benefits of the procedure and the sedation                            options and risks were discussed with the patient.                            All questions were answered, and informed consent                            was obtained. Prior Anticoagulants: The patient has                            taken no anticoagulant or antiplatelet agents. ASA                            Grade Assessment: II - A patient with mild systemic                            disease. After reviewing the risks and benefits,                            the patient was deemed in satisfactory condition to                            undergo the procedure.                           After obtaining informed consent, the colonoscope  was passed under direct vision. Throughout the                            procedure, the patient's blood pressure, pulse, and                            oxygen saturations were monitored continuously. The                            [id number]-HQ190L Colonoscope [id number] was introduced                            through the anus and advanced to the the cecum,                            identified by  appendiceal orifice and ileocecal                            valve. The colonoscopy was performed without                            difficulty. The patient tolerated the procedure                            well. The quality of the bowel preparation was                            good. The ileocecal valve, appendiceal orifice, and                            rectum were photographed. Scope In: 2:57:07 PM Scope Out: 3:09:02 PM Scope Withdrawal Time: 0 hours 9 minutes 27 seconds  Total Procedure Duration: 0 hours 11 minutes 55 seconds  Findings:                 The perianal and digital rectal examinations were                            normal.                           Non-bleeding external and internal hemorrhoids were                            found during retroflexion. The hemorrhoids were                            small. Complications:            No immediate complications. Estimated Blood Loss:     Estimated blood loss: none. Impression:               - Non-bleeding external and internal hemorrhoids.                           - No specimens collected. Recommendation:           -  Resume previous diet.                           - Continue present medications.                           - Repeat colonoscopy in 10 years for surveillance. Camillo Quadros V. Ladarrell Cornwall, MD 07/23/2024 3:22:50 PM This report has been signed electronically.

## 2024-07-23 NOTE — Op Note (Signed)
 [facility] Patient Name: Denise Barr Procedure Date: 07/23/2024 2:40 PM MRN: [id number] Endoscopist: Gustav ALONSO Mcgee , MD, [id number] Age: 53 Referring MD:  Date of Birth: [birth year] Gender: Female Account #: [id number] Procedure:                Upper GI endoscopy Indications:              Suspected esophageal reflux, Esophageal reflux                            symptoms that persist despite appropriate therapy,                            Esophageal reflux symptoms that recur despite                            appropriate therapy Medicines:                Monitored Anesthesia Care Procedure:                Pre-Anesthesia Assessment:                           - Prior to the procedure, a History and Physical                            was performed, and patient medications and                            allergies were reviewed. The patient's tolerance of                            previous anesthesia was also reviewed. The risks                            and benefits of the procedure and the sedation                            options and risks were discussed with the patient.                            All questions were answered, and informed consent                            was obtained. Prior Anticoagulants: The patient has                            taken no anticoagulant or antiplatelet agents. ASA                            Grade Assessment: II - A patient with mild systemic                            disease. After reviewing the risks and benefits,  the patient was deemed in satisfactory condition to                            undergo the procedure.                           After obtaining informed consent, the endoscope was                            passed under direct vision. Throughout the                            procedure, the patient's blood pressure, pulse, and                            oxygen saturations were monitored  continuously. The                            Olympus scope [id number] was introduced through the                            mouth, and advanced to the second part of duodenum.                            The upper GI endoscopy was accomplished without                            difficulty. The patient tolerated the procedure                            well. Scope In: Scope Out: Findings:                 The Z-line was regular and was found 38 cm from the                            incisors. The BRAVO capsule with delivery system                            was introduced through the mouth and advanced into                            the esophagus, such that the BRAVO pH capsule was                            positioned 32 cm from the incisors, which was 6 cm                            proximal to the GE junction. The BRAVO pH capsule                            was then deployed and attached to the esophageal  mucosa. The delivery system was then withdrawn.                            Endoscopy was utilized for probe placement and                            diagnostic evaluation. The scope was reinserted to                            evaluate placement of the BRAVO capsule.                            Visualization showed the BRAVO capsule to be in an                            appropriate position.                           Patchy mild inflammation characterized by                            congestion (edema), erythema and friability was                            found in the entire examined stomach. Biopsies were                            taken with a cold forceps for Helicobacter pylori                            testing.                           The cardia and gastric fundus were normal on                            retroflexion.                           The examined duodenum was normal. Complications:            No immediate complications. Estimated Blood  Loss:     Estimated blood loss was minimal. Impression:               - Z-line regular, 38 cm from the incisors.                           - Gastritis. Biopsied.                           - Normal examined duodenum.                           - The BRAVO pH capsule was deployed. Recommendation:           - Resume previous diet.                           -  Continue present medications.                           - Await pathology results. Elveta Rape V. Shaneika Rossa, MD 07/23/2024 3:26:43 PM This report has been signed electronically.

## 2024-07-23 NOTE — Progress Notes (Signed)
 Capsule expiration date-09-18-25   Capsule ID number [id number]  LES measurement: 38 (capsule placed 6 cm above LES) 32  Time of implant: 1452

## 2024-07-23 NOTE — Progress Notes (Signed)
 [facility] History and Physical   Primary Care Physician:  Katina Pfeiffer, PA-C   Reason for Procedure:  GERD, screening colon  Plan:    EGD and colonoscopy with possible interventions as needed     HPI: Denise Barr is a very pleasant 53 y.o. female here for EGD with Bravo and colonoscopy for recurrent GERD symptoms and colorectal cancer screening. Denies any nausea, vomiting, abdominal pain, melena or bright red blood per rectum  The risks and benefits as well as alternatives of endoscopic procedure(s) have been discussed and reviewed.  The patient was provided an opportunity to ask questions and all were answered. The patient agreed with the plan and demonstrated an understanding of the instructions.   Past Medical History:  Diagnosis Date   Anemia 12/01/2009   with pregnancy only   Anxiety    situational   Dyspnea    with exertion   Eczema    GERD (gastroesophageal reflux disease)    occasional    History of fainting    pt. 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    Past Surgical History:  Procedure Laterality Date   BRAVO PH STUDY N/A 10/22/2013   Procedure: BRAVO PH STUDY;  Surgeon: Belvie JONETTA Just, MD;  Location: [facility];  Service: Endoscopy;  Laterality: N/A;   DIRECT LARYNGOSCOPY N/A 01/22/2016   Procedure: DIRECT LARYNGOSCOPY WITH CO2 Laser ;  Surgeon: Vaughan Ricker, MD;  Location: [facility] OR;  Service: ENT;  Laterality: N/A;  Micro Direct Laryngoscopy with Jet Ventilation   EPIDURAL BLOCK INJECTION     x 3 with childbirth   ESOPHAGOGASTRODUODENOSCOPY N/A 10/22/2013   Procedure: ESOPHAGOGASTRODUODENOSCOPY (EGD);  Surgeon: Belvie JONETTA Just, MD;  Location: [facility];  Service: Endoscopy;  Laterality: N/A;   MICROLARYNGOSCOPY WITH CO2 LASER AND EXCISION OF VOCAL CORD LESION N/A 05/12/2023   Procedure: SUSPENSION MICRODIRECT LARYNGOSCOPY WITH CO2 LASER DILATION AND  MITOMYCIN  C APPLICATION; JET VENTILATION;  Surgeon: Ricker Vaughan, MD;  Location: [facility] OR;  Service: ENT;  Laterality: N/A;   WISDOM TOOTH EXTRACTION  1995    Prior to Admission medications   Medication Sig Start Date End Date Taking? Authorizing Provider  levocetirizine (XYZAL) 5 MG tablet Take 5 mg by mouth every evening. 12/14/15  Yes [provider]  esomeprazole (NEXIUM) 20 MG packet Take 40 mg by mouth daily before breakfast.    [provider]  fluticasone (FLONASE) 50 MCG/ACT nasal spray Place 1 spray into both nostrils daily as needed for allergies or rhinitis.    [provider]    Current Outpatient Medications  Medication Sig Dispense Refill   levocetirizine (XYZAL) 5 MG tablet Take 5 mg by mouth every evening.  5   esomeprazole (NEXIUM) 20 MG packet Take 40 mg by mouth daily before breakfast.     fluticasone (FLONASE) 50 MCG/ACT nasal spray Place 1 spray into both nostrils daily as needed for allergies or rhinitis.     Current Facility-Administered Medications  Medication Dose Route Frequency Provider Last Rate Last Admin   0.9 %  sodium chloride  infusion  500 mL Intravenous Once Jariel Drost V, MD        Allergies as of 07/23/2024   (No Known Allergies)    Family History  Problem Relation Age of Onset   Thyroid  cancer Mother    Barrett's esophagus Father    GER disease  Father    Thyroid  cancer Sister    Esophageal cancer Paternal Grandfather    Colon cancer Neg Hx    Rectal cancer Neg Hx    Stomach cancer Neg Hx     Social History   Socioeconomic History   Marital status: Married    Spouse name: Not on file   Number of children: 3   Years of education: Not on file   Highest education level: Not on file  Occupational History   Not on file  Tobacco Use   Smoking status: Never   Smokeless tobacco: Never  Vaping Use   Vaping status: Never Used  Substance and Sexual Activity   Alcohol use: Yes    Comment: 2 glasses of wine a  month maybe-   Drug use: No   Sexual activity: Not on file  Other Topics Concern   Not on file  Social History Narrative   Not on file   Social Drivers of Health   Financial Resource Strain: Not on file  Food Insecurity: Not on file  Transportation Needs: Not on file  Physical Activity: Not on file  Stress: Not on file  Social Connections: Not on file  Intimate Partner Violence: Not on file    Review of Systems:  All other review of systems negative except as mentioned in the HPI.  Physical Exam: Vital signs in last 24 hours: BP 131/76   Pulse 90   Temp 98.2 F (36.8 C) (Skin)   Ht 5' 5 (1.651 m)   Wt 161 lb (73 kg)   SpO2 96%   BMI 26.79 kg/m  General:   Alert, NAD Lungs:  Clear .   Heart:  Regular rate and rhythm Abdomen:  Soft, nontender and nondistended. Neuro/Psych:  Alert and cooperative. Normal mood and affect. A and O x 3  Reviewed labs, radiology imaging, old records and pertinent past GI work up  Patient is appropriate for planned procedure(s) and anesthesia in an ambulatory setting   K. Veena Soul Deveney , MD [phone]

## 2024-07-23 NOTE — Progress Notes (Signed)
 Transferred to PACU via stretcher.  Not responding to stimulation at this time.  VSS upon leaving procedure room.

## 2024-07-26 ENCOUNTER — Telehealth: Payer: Self-pay | Admitting: *Deleted

## 2024-07-26 NOTE — Telephone Encounter (Signed)
  Follow up Call-     07/23/2024    2:10 PM 06/11/2024   10:11 AM  Call back number  Post procedure Call Back phone  # [phone]  Permission to leave phone message Yes Yes     Patient questions:  Do you have a fever, pain , or abdominal swelling? No. Pain Score  0 *  Have you tolerated food without any problems? Yes.    Have you been able to return to your normal activities? Yes.    Do you have any questions about your discharge instructions: Diet   No. Medications  No. Follow up visit  No.  Do you have questions or concerns about your Care? No.  Actions: * If pain score is 4 or above: No action needed, pain <4.

## 2024-07-28 LAB — SURGICAL PATHOLOGY

## 2024-08-03 ENCOUNTER — Encounter: Payer: Self-pay | Admitting: Gastroenterology

## 2024-08-03 DIAGNOSIS — Z01419 Encounter for gynecological examination (general) (routine) without abnormal findings: Secondary | ICD-10-CM | POA: Diagnosis not present

## 2024-08-03 DIAGNOSIS — Z1231 Encounter for screening mammogram for malignant neoplasm of breast: Secondary | ICD-10-CM | POA: Diagnosis not present

## 2024-08-03 DIAGNOSIS — Z130 Encounter for screening for diseases of the blood and blood-forming organs and certain disorders involving the immune mechanism: Secondary | ICD-10-CM | POA: Diagnosis not present

## 2024-08-03 DIAGNOSIS — R87610 Atypical squamous cells of undetermined significance on cytologic smear of cervix (ASC-US): Secondary | ICD-10-CM | POA: Diagnosis not present

## 2024-08-03 NOTE — Progress Notes (Signed)
 Duplicate encounter.

## 2024-08-12 ENCOUNTER — Encounter: Payer: Self-pay | Admitting: Gastroenterology

## 2024-08-23 ENCOUNTER — Ambulatory Visit: Payer: Self-pay | Admitting: Gastroenterology
